# Patient Record
Sex: Male | Born: 1980 | Race: White | Hispanic: No | Marital: Single | Smoking: Current every day smoker
Health system: Southern US, Community
[De-identification: ages and names within clinical notes are randomized; demographics above are authoritative.]

## PROBLEM LIST (undated history)

## (undated) DIAGNOSIS — J189 Pneumonia, unspecified organism: Secondary | ICD-10-CM

## (undated) DIAGNOSIS — F1910 Other psychoactive substance abuse, uncomplicated: Secondary | ICD-10-CM

## (undated) HISTORY — PX: NO PAST SURGERIES: SHX2092

---

## 2005-08-31 ENCOUNTER — Emergency Department: Payer: Self-pay | Admitting: Emergency Medicine

## 2006-06-13 ENCOUNTER — Emergency Department: Payer: Self-pay | Admitting: Unknown Physician Specialty

## 2006-07-30 ENCOUNTER — Emergency Department: Payer: Self-pay | Admitting: Emergency Medicine

## 2007-11-14 ENCOUNTER — Emergency Department: Payer: Self-pay | Admitting: Emergency Medicine

## 2008-03-28 ENCOUNTER — Emergency Department: Payer: Self-pay | Admitting: Emergency Medicine

## 2008-07-02 ENCOUNTER — Emergency Department: Payer: Self-pay | Admitting: Emergency Medicine

## 2008-09-20 ENCOUNTER — Emergency Department: Payer: Self-pay | Admitting: Emergency Medicine

## 2008-09-21 ENCOUNTER — Emergency Department: Payer: Self-pay | Admitting: Unknown Physician Specialty

## 2011-06-13 ENCOUNTER — Emergency Department: Payer: Self-pay | Admitting: Emergency Medicine

## 2013-07-31 ENCOUNTER — Other Ambulatory Visit: Payer: Self-pay

## 2013-07-31 LAB — DRUG SCREEN, URINE
Amphetamines, Ur Screen: NEGATIVE (ref ?–1000)
Barbiturates, Ur Screen: NEGATIVE (ref ?–200)
Benzodiazepine, Ur Scrn: NEGATIVE (ref ?–200)
COCAINE METABOLITE, UR ~~LOC~~: NEGATIVE (ref ?–300)
Cannabinoid 50 Ng, Ur ~~LOC~~: NEGATIVE (ref ?–50)
MDMA (ECSTASY) UR SCREEN: NEGATIVE (ref ?–500)
METHADONE, UR SCREEN: NEGATIVE (ref ?–300)
Opiate, Ur Screen: NEGATIVE (ref ?–300)
PHENCYCLIDINE (PCP) UR S: NEGATIVE (ref ?–25)
Tricyclic, Ur Screen: NEGATIVE (ref ?–1000)

## 2013-08-01 ENCOUNTER — Emergency Department: Payer: Self-pay

## 2013-08-01 LAB — CBC WITH DIFFERENTIAL/PLATELET
BASOS ABS: 0 10*3/uL (ref 0.0–0.1)
Basophil %: 0.4 %
EOS PCT: 0.5 %
Eosinophil #: 0 10*3/uL (ref 0.0–0.7)
HCT: 44.3 % (ref 40.0–52.0)
HGB: 15.2 g/dL (ref 13.0–18.0)
Lymphocyte #: 1.8 10*3/uL (ref 1.0–3.6)
Lymphocyte %: 18.7 %
MCH: 31 pg (ref 26.0–34.0)
MCHC: 34.4 g/dL (ref 32.0–36.0)
MCV: 90 fL (ref 80–100)
MONOS PCT: 7.1 %
Monocyte #: 0.7 x10 3/mm (ref 0.2–1.0)
Neutrophil #: 7.2 10*3/uL — ABNORMAL HIGH (ref 1.4–6.5)
Neutrophil %: 73.3 %
Platelet: 340 10*3/uL (ref 150–440)
RBC: 4.92 10*6/uL (ref 4.40–5.90)
RDW: 13.6 % (ref 11.5–14.5)
WBC: 9.8 10*3/uL (ref 3.8–10.6)

## 2013-08-01 LAB — BASIC METABOLIC PANEL
Anion Gap: 6 — ABNORMAL LOW (ref 7–16)
BUN: 14 mg/dL (ref 7–18)
Calcium, Total: 9.2 mg/dL (ref 8.5–10.1)
Chloride: 107 mmol/L (ref 98–107)
Co2: 27 mmol/L (ref 21–32)
Creatinine: 0.53 mg/dL — ABNORMAL LOW (ref 0.60–1.30)
EGFR (African American): >60
Glucose: 104 mg/dL — ABNORMAL HIGH (ref 65–99)
Osmolality: 280 (ref 275–301)
POTASSIUM: 4 mmol/L (ref 3.5–5.1)
Sodium: 140 mmol/L (ref 136–145)

## 2013-08-01 LAB — TROPONIN I

## 2013-08-01 LAB — URINALYSIS, COMPLETE
BACTERIA: NONE SEEN
BILIRUBIN, UR: NEGATIVE
GLUCOSE, UR: NEGATIVE mg/dL (ref 0–75)
Ketone: NEGATIVE
LEUKOCYTE ESTERASE: NEGATIVE
Nitrite: NEGATIVE
PH: 7 (ref 4.5–8.0)
Protein: NEGATIVE
Specific Gravity: 1.009 (ref 1.003–1.030)
Squamous Epithelial: <1

## 2013-08-01 LAB — TSH: THYROID STIMULATING HORM: 0.741 u[IU]/mL

## 2013-08-02 ENCOUNTER — Emergency Department: Payer: Self-pay | Admitting: Emergency Medicine

## 2013-08-02 LAB — BASIC METABOLIC PANEL
Anion Gap: 4 — ABNORMAL LOW (ref 7–16)
BUN: 20 mg/dL — ABNORMAL HIGH (ref 7–18)
CALCIUM: 9.3 mg/dL (ref 8.5–10.1)
Chloride: 108 mmol/L — ABNORMAL HIGH (ref 98–107)
Co2: 28 mmol/L (ref 21–32)
Creatinine: 0.53 mg/dL — ABNORMAL LOW (ref 0.60–1.30)
EGFR (African American): >60
EGFR (Non-African Amer.): >60
Glucose: 150 mg/dL — ABNORMAL HIGH (ref 65–99)
OSMOLALITY: 285 (ref 275–301)
Potassium: 3.9 mmol/L (ref 3.5–5.1)
Sodium: 140 mmol/L (ref 136–145)

## 2013-08-02 LAB — CBC
HCT: 45.1 % (ref 40.0–52.0)
HGB: 14.9 g/dL (ref 13.0–18.0)
MCH: 29.8 pg (ref 26.0–34.0)
MCHC: 32.9 g/dL (ref 32.0–36.0)
MCV: 90 fL (ref 80–100)
Platelet: 362 10*3/uL (ref 150–440)
RBC: 5 10*6/uL (ref 4.40–5.90)
RDW: 14 % (ref 11.5–14.5)
WBC: 11.8 10*3/uL — AB (ref 3.8–10.6)

## 2013-08-03 LAB — HEPATIC FUNCTION PANEL A (ARMC)
ALBUMIN: 4 g/dL (ref 3.4–5.0)
ALT: 17 U/L (ref 12–78)
AST: 13 U/L — AB (ref 15–37)
Alkaline Phosphatase: 84 U/L
BILIRUBIN TOTAL: 0.2 mg/dL (ref 0.2–1.0)
Bilirubin, Direct: <0.1 mg/dL (ref 0.00–0.20)
Total Protein: 7.6 g/dL (ref 6.4–8.2)

## 2013-08-10 ENCOUNTER — Encounter (HOSPITAL_COMMUNITY): Payer: Self-pay | Admitting: Emergency Medicine

## 2013-08-10 ENCOUNTER — Inpatient Hospital Stay (HOSPITAL_COMMUNITY)
Admission: EM | Admit: 2013-08-10 | Discharge: 2013-08-16 | DRG: 177 | Attending: Internal Medicine | Admitting: Internal Medicine

## 2013-08-10 ENCOUNTER — Emergency Department (HOSPITAL_COMMUNITY)

## 2013-08-10 DIAGNOSIS — E860 Dehydration: Secondary | ICD-10-CM | POA: Diagnosis present

## 2013-08-10 DIAGNOSIS — F17200 Nicotine dependence, unspecified, uncomplicated: Secondary | ICD-10-CM | POA: Diagnosis present

## 2013-08-10 DIAGNOSIS — F1910 Other psychoactive substance abuse, uncomplicated: Secondary | ICD-10-CM | POA: Diagnosis present

## 2013-08-10 DIAGNOSIS — IMO0002 Reserved for concepts with insufficient information to code with codable children: Secondary | ICD-10-CM | POA: Diagnosis not present

## 2013-08-10 DIAGNOSIS — E43 Unspecified severe protein-calorie malnutrition: Secondary | ICD-10-CM

## 2013-08-10 DIAGNOSIS — J690 Pneumonitis due to inhalation of food and vomit: Secondary | ICD-10-CM | POA: Diagnosis present

## 2013-08-10 DIAGNOSIS — Z5987 Material hardship due to limited financial resources, not elsewhere classified: Secondary | ICD-10-CM

## 2013-08-10 DIAGNOSIS — R1310 Dysphagia, unspecified: Secondary | ICD-10-CM | POA: Diagnosis present

## 2013-08-10 DIAGNOSIS — Z79899 Other long term (current) drug therapy: Secondary | ICD-10-CM

## 2013-08-10 DIAGNOSIS — B370 Candidal stomatitis: Secondary | ICD-10-CM | POA: Diagnosis present

## 2013-08-10 DIAGNOSIS — G219 Secondary parkinsonism, unspecified: Secondary | ICD-10-CM | POA: Diagnosis present

## 2013-08-10 DIAGNOSIS — I10 Essential (primary) hypertension: Secondary | ICD-10-CM | POA: Diagnosis present

## 2013-08-10 DIAGNOSIS — G253 Myoclonus: Secondary | ICD-10-CM | POA: Diagnosis present

## 2013-08-10 DIAGNOSIS — J189 Pneumonia, unspecified organism: Secondary | ICD-10-CM

## 2013-08-10 DIAGNOSIS — Z598 Other problems related to housing and economic circumstances: Secondary | ICD-10-CM

## 2013-08-10 DIAGNOSIS — B009 Herpesviral infection, unspecified: Secondary | ICD-10-CM | POA: Diagnosis present

## 2013-08-10 DIAGNOSIS — G20 Parkinson's disease: Secondary | ICD-10-CM | POA: Diagnosis present

## 2013-08-10 DIAGNOSIS — D72829 Elevated white blood cell count, unspecified: Secondary | ICD-10-CM | POA: Diagnosis present

## 2013-08-10 DIAGNOSIS — T434X5A Adverse effect of butyrophenone and thiothixene neuroleptics, initial encounter: Secondary | ICD-10-CM | POA: Diagnosis present

## 2013-08-10 DIAGNOSIS — F1999 Other psychoactive substance use, unspecified with unspecified psychoactive substance-induced disorder: Secondary | ICD-10-CM

## 2013-08-10 DIAGNOSIS — Z23 Encounter for immunization: Secondary | ICD-10-CM

## 2013-08-10 HISTORY — DX: Pneumonia, unspecified organism: J18.9

## 2013-08-10 HISTORY — DX: Other psychoactive substance abuse, uncomplicated: F19.10

## 2013-08-10 LAB — CBC WITH DIFFERENTIAL/PLATELET
Basophils Absolute: 0 10*3/uL (ref 0.0–0.1)
Basophils Relative: 0 % (ref 0–1)
EOS ABS: 0.1 10*3/uL (ref 0.0–0.7)
EOS PCT: 1 % (ref 0–5)
HCT: 43.9 % (ref 39.0–52.0)
HEMOGLOBIN: 15.2 g/dL (ref 13.0–17.0)
LYMPHS ABS: 2 10*3/uL (ref 0.7–4.0)
Lymphocytes Relative: 17 % (ref 12–46)
MCH: 31 pg (ref 26.0–34.0)
MCHC: 34.6 g/dL (ref 30.0–36.0)
MCV: 89.6 fL (ref 78.0–100.0)
MONOS PCT: 6 % (ref 3–12)
Monocytes Absolute: 0.8 10*3/uL (ref 0.1–1.0)
Neutro Abs: 9.2 10*3/uL — ABNORMAL HIGH (ref 1.7–7.7)
Neutrophils Relative %: 76 % (ref 43–77)
PLATELETS: 360 10*3/uL (ref 150–400)
RBC: 4.9 MIL/uL (ref 4.22–5.81)
RDW: 13.5 % (ref 11.5–15.5)
WBC: 12.2 10*3/uL — ABNORMAL HIGH (ref 4.0–10.5)

## 2013-08-10 LAB — COMPREHENSIVE METABOLIC PANEL
ALK PHOS: 78 U/L (ref 39–117)
ALT: 15 U/L (ref 0–53)
AST: 15 U/L (ref 0–37)
Albumin: 4 g/dL (ref 3.5–5.2)
BUN: 16 mg/dL (ref 6–23)
CALCIUM: 9.5 mg/dL (ref 8.4–10.5)
CO2: 29 mEq/L (ref 19–32)
Chloride: 103 mEq/L (ref 96–112)
Creatinine, Ser: 0.63 mg/dL (ref 0.50–1.35)
GFR calc non Af Amer: >90 mL/min (ref >90)
GLUCOSE: 95 mg/dL (ref 70–99)
Potassium: 4 mEq/L (ref 3.7–5.3)
Sodium: 147 mEq/L (ref 137–147)
TOTAL PROTEIN: 7.5 g/dL (ref 6.0–8.3)
Total Bilirubin: 0.7 mg/dL (ref 0.3–1.2)

## 2013-08-10 LAB — I-STAT CHEM 8, ED
BUN: 16 mg/dL (ref 6–23)
CREATININE: 0.7 mg/dL (ref 0.50–1.35)
Calcium, Ion: 1.19 mmol/L (ref 1.12–1.23)
Chloride: 101 mEq/L (ref 96–112)
GLUCOSE: 95 mg/dL (ref 70–99)
HCT: 46 % (ref 39.0–52.0)
HEMOGLOBIN: 15.6 g/dL (ref 13.0–17.0)
Potassium: 3.8 mEq/L (ref 3.7–5.3)
Sodium: 145 mEq/L (ref 137–147)
TCO2: 32 mmol/L (ref 0–100)

## 2013-08-10 LAB — RAPID URINE DRUG SCREEN, HOSP PERFORMED
Amphetamines: NOT DETECTED
BENZODIAZEPINES: NOT DETECTED
Barbiturates: NOT DETECTED
COCAINE: NOT DETECTED
Opiates: NOT DETECTED
Tetrahydrocannabinol: NOT DETECTED

## 2013-08-10 LAB — MRSA PCR SCREENING: MRSA by PCR: NEGATIVE

## 2013-08-10 LAB — LIPASE, BLOOD: Lipase: 12 U/L (ref 11–59)

## 2013-08-10 LAB — I-STAT CG4 LACTIC ACID, ED: Lactic Acid, Venous: 1.48 mmol/L (ref 0.5–2.2)

## 2013-08-10 LAB — ACETAMINOPHEN LEVEL: Acetaminophen (Tylenol), Serum: <15 ug/mL (ref 10–30)

## 2013-08-10 LAB — HIV ANTIBODY (ROUTINE TESTING W REFLEX): HIV: NONREACTIVE

## 2013-08-10 LAB — ETHANOL: Alcohol, Ethyl (B): <11 mg/dL (ref 0–11)

## 2013-08-10 LAB — CK: Total CK: 117 U/L (ref 7–232)

## 2013-08-10 LAB — CBG MONITORING, ED: Glucose-Capillary: 90 mg/dL (ref 70–99)

## 2013-08-10 MED ORDER — DEXTROSE 5 % IV SOLN
500.0000 mg | INTRAVENOUS | Status: DC
  Administered 2013-08-10 – 2013-08-13 (×4): 500 mg via INTRAVENOUS
  Filled 2013-08-10 (×5): qty 500

## 2013-08-10 MED ORDER — CHLORHEXIDINE GLUCONATE 0.12 % MT SOLN
15.0000 mL | Freq: Two times a day (BID) | OROMUCOSAL | Status: DC
  Administered 2013-08-10 – 2013-08-16 (×9): 15 mL via OROMUCOSAL
  Filled 2013-08-10 (×14): qty 15

## 2013-08-10 MED ORDER — SODIUM CHLORIDE 0.9 % IV SOLN
1.5000 g | Freq: Four times a day (QID) | INTRAVENOUS | Status: DC
  Administered 2013-08-10 – 2013-08-14 (×16): 1.5 g via INTRAVENOUS
  Filled 2013-08-10 (×19): qty 1.5

## 2013-08-10 MED ORDER — SODIUM CHLORIDE 0.9 % IV BOLUS (SEPSIS)
1000.0000 mL | Freq: Once | INTRAVENOUS | Status: AC
  Administered 2013-08-10: 1000 mL via INTRAVENOUS

## 2013-08-10 MED ORDER — HALOPERIDOL 2 MG PO TABS
2.0000 mg | ORAL_TABLET | Freq: Three times a day (TID) | ORAL | Status: DC
  Administered 2013-08-10 – 2013-08-11 (×2): 2 mg via ORAL
  Filled 2013-08-10 (×4): qty 1

## 2013-08-10 MED ORDER — MAGIC MOUTHWASH W/LIDOCAINE
10.0000 mL | Freq: Four times a day (QID) | ORAL | Status: DC | PRN
Start: 2013-08-10 — End: 2013-08-11
  Administered 2013-08-10: 10 mL via ORAL
  Filled 2013-08-10: qty 10

## 2013-08-10 MED ORDER — OLANZAPINE 10 MG PO TABS
10.0000 mg | ORAL_TABLET | Freq: Every day | ORAL | Status: DC
  Administered 2013-08-10 – 2013-08-14 (×5): 10 mg via ORAL
  Filled 2013-08-10 (×6): qty 1

## 2013-08-10 MED ORDER — WHITE PETROLATUM GEL
Status: DC | PRN
  Administered 2013-08-10: 0.2 via TOPICAL
  Filled 2013-08-10: qty 5
  Filled 2013-08-10: qty 28.35

## 2013-08-10 MED ORDER — PNEUMOCOCCAL VAC POLYVALENT 25 MCG/0.5ML IJ INJ
0.5000 mL | INJECTION | INTRAMUSCULAR | Status: AC
  Administered 2013-08-11: 0.5 mL via INTRAMUSCULAR
  Filled 2013-08-10: qty 0.5

## 2013-08-10 MED ORDER — SODIUM CHLORIDE 0.9 % IV SOLN
INTRAVENOUS | Status: AC
  Administered 2013-08-10: 1000 mL via INTRAVENOUS

## 2013-08-10 MED ORDER — HEPARIN SODIUM (PORCINE) 5000 UNIT/ML IJ SOLN
5000.0000 [IU] | Freq: Three times a day (TID) | INTRAMUSCULAR | Status: DC
  Administered 2013-08-11 – 2013-08-16 (×16): 5000 [IU] via SUBCUTANEOUS
  Filled 2013-08-10 (×20): qty 1

## 2013-08-10 MED ORDER — BIOTENE DRY MOUTH MT LIQD: 15.0000 mL | Freq: Two times a day (BID) | OROMUCOSAL | Status: DC

## 2013-08-10 MED ORDER — NYSTATIN 100000 UNIT/ML MT SUSP
5.0000 mL | Freq: Four times a day (QID) | OROMUCOSAL | Status: DC
  Administered 2013-08-10 – 2013-08-16 (×21): 500000 [IU] via ORAL
  Filled 2013-08-10 (×25): qty 5

## 2013-08-10 MED ORDER — LORAZEPAM 1 MG PO TABS
2.0000 mg | ORAL_TABLET | Freq: Three times a day (TID) | ORAL | Status: DC
  Administered 2013-08-10 – 2013-08-11 (×2): 2 mg via ORAL
  Filled 2013-08-10 (×2): qty 2

## 2013-08-10 MED ORDER — INFLUENZA VAC SPLIT QUAD 0.5 ML IM SUSP
0.5000 mL | INTRAMUSCULAR | Status: AC
  Administered 2013-08-11: 0.5 mL via INTRAMUSCULAR
  Filled 2013-08-10: qty 0.5

## 2013-08-10 MED ORDER — BENZTROPINE MESYLATE 1 MG PO TABS
1.0000 mg | ORAL_TABLET | Freq: Three times a day (TID) | ORAL | Status: DC
  Administered 2013-08-10 – 2013-08-11 (×2): 1 mg via ORAL
  Filled 2013-08-10 (×4): qty 1

## 2013-08-10 NOTE — ED Notes (Signed)
Pt unable to drink PO fluids.  Pt drooling upon trying.  Still reporting weakness.

## 2013-08-10 NOTE — Progress Notes (Signed)
Patient didn't voide since he is being in ED. At bladder scan was found 527cc urine and I notified K. Schorr, NP at 19:30 o'clock. We are waiting for new order.

## 2013-08-10 NOTE — ED Notes (Signed)
Pt back from x-ray.

## 2013-08-10 NOTE — ED Notes (Signed)
Spoke with April-- charge nurse at jail -- [phone]---hx -- [facility]-- was stumbling, falling down, hitting head-- withdrawing from opiates and suboxone. Placed on Jail withdrawal policy receiving Ativan, meclizine, and was started on Haldol, cogentin due to admitting auditory and visual hallucinations.  Was [facility]- from 2/4--2/18-- did not receive any meds while there. [facility], pt was drooling, speech difficult to understand, was sent to medical unit and received IV fluids, IV ativan. Has not improved-- sent here.

## 2013-08-10 NOTE — ED Notes (Signed)
Admitting at bedside 

## 2013-08-10 NOTE — ED Notes (Signed)
Report called to Angela, RN.

## 2013-08-10 NOTE — ED Notes (Signed)
TO ED via sheriff's deputy [facility]-- with c/o weakness- drooling, unable to control secretions. Pt extremely emaciated, drooling, able to talk, but speech difficult to understand. Tongue dry coated with brownish dried mucus. Alert/ Oriented x 3-- deputy states that [facility], was [facility], and returned last week in this condition. [facility] since last Monday receiving IV fluids.

## 2013-08-10 NOTE — Progress Notes (Signed)
Returned call from Merdis DelayK. Schorr, NP and she gave an order for in/out cath.

## 2013-08-10 NOTE — ED Notes (Signed)
Oral hygiene done--

## 2013-08-10 NOTE — H&P (Signed)
[facility] History and Physical  Chase Bush WGN:[id number] DOB: 30-Sep-1980 DOA: 08/10/2013  Referring physician: Dr. Rubin Payor PCP: No PCP Per Patient   Chief Complaint: not able to swallow  HPI: Chase Bush is a 33 y.o. male  With past medical history of psychiatric disorders also been so withdrawals, who was sent from [facility] as he was spitting up his med as he couldn't swallow. He's also been complaining of drooling, considerable speech difficulties , tremor in his upper extremities and lacks the ability of performing ADLs. The patient relates some cough with drinking fluids and relates that he cannot swallow.  In the ED: Vitals were within normal limits, basic metabolic panel was done that showed her creatinine at baseline a CBC was done that shows a white count of 12.2 chest x-ray was done that shows a right lower lobe infiltrate we're consulted for further evaluation.   Review of Systems:  Constitutional:  No weight loss, night sweats, Fevers, chills, fatigue.  HEENT:  Drooling, mouth pain Cardio-vascular:  No chest pain, Orthopnea, PND, swelling in lower extremities, anasarca, dizziness, palpitations  GI:  No heartburn, indigestion, abdominal pain, nausea, vomiting, diarrhea, change in bowel habits, loss of appetite  Resp:  No shortness of breath with exertion or at rest. .No wheezing.No chest wall deformity  Skin:  no rash or lesions.  GU:  no dysuria, change in color of urine, no urgency or frequency. No flank pain.  Musculoskeletal:  No joint pain or swelling. No decreased range of motion. No back pain.  Psych:  No change in mood or affect. No depression or anxiety. No memory loss.   Past Medical History  Diagnosis Date  . Substance abuse     was using suboxone until Jan 3rd. also hx of benzo abuse.    No past surgical history on file. Social History:  reports that he has quit smoking. He does not have any smokeless tobacco  history on file. He reports that he uses illicit drugs. He reports that he does not drink alcohol.  No Known Allergies  History reviewed. No pertinent family history.   Prior to Admission medications   Medication Sig Start Date End Date Taking? Authorizing Provider  benztropine (COGENTIN) 1 MG tablet Take 1 mg by mouth 3 (three) times daily.   Yes Historical Provider, MD  haloperidol (HALDOL) 10 MG tablet Take 10 mg by mouth 3 (three) times daily.   Yes Historical Provider, MD  LORazepam (ATIVAN) 2 MG tablet Take 2 mg by mouth 3 (three) times daily.   Yes Historical Provider, MD  OLANZapine (ZYPREXA) 10 MG tablet Take 10 mg by mouth at bedtime.   Yes Historical Provider, MD   Physical Exam: Filed Vitals:   08/10/13 1630  BP: 136/80  Pulse: 55  Temp:   Resp: 9    BP 136/80  Pulse 55  Temp(Src) 98 F (36.7 C) (Oral)  Resp 9  SpO2 100%  General:  Appears calm and comfortable, cachectic appearing Eyes: PERRL, normal lids, irises & conjunctiva ENT: Dry mucous membranes Neck: no LAD, masses or thyromegaly Cardiovascular: RRR, no m/r/g. No LE edema. Telemetry: SR, no arrhythmias  Respiratory: Good air movement with crackles on the right no wheezing. Abdomen: soft, ntnd Skin: no rash or induration seen on limited exam. Musculoskeletal: grossly normal tone BUE/BLE Psychiatric: Decrease mood and affect Neurologic: Mild clonus in his upper extremities, with stiff neck and stiff movements passive and active. 3-12 are grossly intact, sensation is intact.  Labs on Admission:  Basic Metabolic Panel:  Recent Labs Lab 08/10/13 1420 08/10/13 1501  NA 147 145  K 4.0 3.8  CL 103 101  CO2 29  --   GLUCOSE 95 95  BUN 16 16  CREATININE 0.63 0.70  CALCIUM 9.5  --    Liver Function Tests:  Recent Labs Lab 08/10/13 1420  AST 15  ALT 15  ALKPHOS 78  BILITOT 0.7  PROT 7.5  ALBUMIN 4.0    Recent Labs Lab 08/10/13 1420  LIPASE 12   No results found for this  basename: AMMONIA,  in the last 168 hours CBC:  Recent Labs Lab 08/10/13 1420 08/10/13 1501  WBC 12.2*  --   NEUTROABS 9.2*  --   HGB 15.2 15.6  HCT 43.9 46.0  MCV 89.6  --   PLT 360  --    Cardiac Enzymes:  Recent Labs Lab 08/10/13 1420  CKTOTAL 117    BNP (last 3 results) No results found for this basename: PROBNP,  in the last 8760 hours CBG:  Recent Labs Lab 08/10/13 1359  GLUCAP 90    Radiological Exams on Admission: Dg Chest 2 View  08/10/2013   CLINICAL DATA:  Clinical dehydration  EXAM: CHEST  2 VIEW  COMPARISON:  DG CHEST 2V dated 08/01/2013  FINDINGS: The lungs are well-expanded. There is new mildly increased density adjacent to the cardiac apex on the frontal film which projects in the retrocardiac region on the lateral film. There is no pleural effusion or pneumothorax. The cardiac silhouette is normal in size. The pulmonary vascularity is not engorged. The mediastinum is normal in width. The observed portions of the bony thorax appear normal.  IMPRESSION: There is minimal subsegmental atelectasis or early pneumonia in the anterior aspect of the left lower lobe. Elsewhere the lung parenchyma appears clear. There is no evidence of CHF.   Electronically Signed   By: David  [place]az[place]   On: 08/10/2013 16:29    EKG: Independently reviewed. SR  Assessment/Plan Aspiration pneumonia: - I will go ahead and start him on Unasyn and azithromycin to cover for community associated pneumonia the most likely cause of his infiltrate on chest x-ray and leukocytosis this aspiration pneumonia. - Get sputum cultures. Place him n.p.o. place an NG tube and start Jevity. - Urine Legionella, will consult dietitian to strut Jevity. - Tylenol for fever.  Dehydration: - Continue IV fluids monitor strict I's and O's.  Myoclonus/ parkinsonian syndrome: - This most likely a side effect of one of his medications I am guessing Haldol. We'll go ahead and decrease the dose of Haldol continue  the Cogentin we'll consult psychiatry.  Severe protein caloric malnutrition: - Unclear etiology we'll consult dietitian for tube feeding. Question is this due to side effects of this medication.  Code Status: full Family Communication: none Disposition Plan: inpatient  Time spent: 85 minutes  Marinda ElkFELIZ ORTIZ, ABRAHAM [facility] Pager [phone]

## 2013-08-10 NOTE — ED Provider Notes (Signed)
CSN: [id number]     Arrival date & time 08/10/13  1345 History   First MD Initiated Contact with Patient 08/10/13 1351     No chief complaint on file.  HPI Comments: 33 yo M hx of unknown psychiatric disorder presents from jail with CC dehydration.  Pt has been in jail system since January 3rd.  At that time pt was being treated for opiate, and benzo withdrawal (hx of suboxone, xanax abuse).  At that time he had symptoms of falling, hitting head, drooling, auditory and visual hallucination.  He was started on Haldol, Ativan, Zyprexa, and Cogentin at that time.  Pt left [place] on 2/4, to [place] from 2/4-2/18 and was off of all medications at that time.  He returned 1 week ago.  It was noted at that time of return that pt was "withdrawing".  He was restarted on IV Ativan, IVF, and then transitioned to Haldol 10 mg PO TID, Ativan 2 mg PO TID, Zyprexa 10 mg PO QHS, and Cogentin 1 mg PO TID.  Pt states he has been unable to eat in the last three days, and feels very fatigued.  He states his mouth hurts, and it very dry, and that he feels unable to swallow.  He denies fever, chills, CP, cough, dyspnea, abdominal pain, nausea, vomiting, diarrhea, constipation, rash, myalgias, or any other symptoms.    The history is provided by the patient and the EMS personnel. No language interpreter was used.    No past medical history on file. No past surgical history on file. No family history on file. History  Substance Use Topics  . Smoking status: Not on file  . Smokeless tobacco: Not on file  . Alcohol Use: Not on file    Review of Systems  Constitutional: Positive for fatigue. Negative for fever and chills.  HENT: Positive for mouth sores and trouble swallowing. Negative for congestion, rhinorrhea, sinus pressure, sore throat and voice change.   Respiratory: Negative for cough and shortness of breath.   Cardiovascular: Negative for chest pain.  Gastrointestinal: Negative for nausea,  vomiting, abdominal pain and constipation.  Genitourinary: Negative for dysuria, hematuria and difficulty urinating.  Musculoskeletal: Negative for myalgias.  Skin: Negative for rash.  Neurological: Negative for dizziness, weakness, light-headedness, numbness and headaches.  Hematological: Negative for adenopathy. Does not bruise/bleed easily.  Psychiatric/Behavioral: Negative for suicidal ideas, hallucinations and self-injury.  All other systems reviewed and are negative.      Allergies  Review of patient's allergies indicates not on file.  Home Medications  No current outpatient prescriptions on file. There were no vitals taken for this visit. Physical Exam  Nursing note and vitals reviewed. Constitutional: He is oriented to person, place, and time.  Cachexia, generally ill appearing.  HENT:  Head: Normocephalic.  Right Ear: External ear normal.  Left Ear: External ear normal.  Dry oral mucosa.  Oral candidiasis with mucosal breakdown.  Poor dentition.    Eyes: Conjunctivae and EOM are normal. Pupils are equal, round, and reactive to light.  Neck: Normal range of motion. Neck supple.  Cardiovascular: Normal rate, regular rhythm, normal heart sounds and intact distal pulses.   Pulmonary/Chest: Effort normal and breath sounds normal. No respiratory distress. He has no wheezes. He has no rales. He exhibits no tenderness.  Abdominal: Soft. Bowel sounds are normal. He exhibits no distension and no mass. There is no tenderness. There is no rebound and no guarding.  Musculoskeletal: Normal range of motion.  Neurological: He  is alert and oriented to person, place, and time.  CN II-XII grossly intact.  4/5 strength in bilateral hands.  5/5 strength BLE.  No clonus.  No sensory deficit.    Skin: Skin is warm and dry.    ED Course  Procedures (including critical care time) Labs Review Labs Reviewed  CBC WITH DIFFERENTIAL - Abnormal; Notable for the following:    WBC 12.2 (*)     Neutro Abs 9.2 (*)    All other components within normal limits  COMPREHENSIVE METABOLIC PANEL  LIPASE, BLOOD  ACETAMINOPHEN LEVEL  ETHANOL  CK  URINALYSIS, ROUTINE W REFLEX MICROSCOPIC  URINE RAPID DRUG SCREEN (HOSP PERFORMED)  HIV ANTIBODY (ROUTINE TESTING)  I-STAT CG4 LACTIC ACID, ED  I-STAT CHEM 8, ED  CBG MONITORING, ED   Imaging Review Dg Chest 2 View  08/10/2013   CLINICAL DATA:  Clinical dehydration  EXAM: CHEST  2 VIEW  COMPARISON:  DG CHEST 2V dated 08/01/2013  FINDINGS: The lungs are well-expanded. There is new mildly increased density adjacent to the cardiac apex on the frontal film which projects in the retrocardiac region on the lateral film. There is no pleural effusion or pneumothorax. The cardiac silhouette is normal in size. The pulmonary vascularity is not engorged. The mediastinum is normal in width. The observed portions of the bony thorax appear normal.  IMPRESSION: There is minimal subsegmental atelectasis or early pneumonia in the anterior aspect of the left lower lobe. Elsewhere the lung parenchyma appears clear. There is no evidence of CHF.   Electronically Signed   By: David  [place]   On: 08/10/2013 16:29    EKG Interpretation   None       MDM   Final diagnoses:  None   33 yo M hx of unknown psychiatric disorder presents from jail with CC dehydration.  Filed Vitals:   08/10/13 1406  BP: 149/92  Temp: 98 F (36.7 C)  Resp: 16   Physical exam as above.  Pt afebrile.  Pt appears ill, dehydrated, oral candidiasis with some mucosal breakdown.  Pt states feels very fatigued.   CBC with mild leukocytosis w/ left shift, CMP, Lipase, Tylenol, LA, Ethanol, CK, all WNL.  HIV antibody sent.  Pt hydrated w/ 2 L NS.  Mouth care performed by nursing.  CXR performed which demonstrates possible LLL early PNA vs atelectasis.  Pt unable to swallow water, and unable to stand without much assistance per nursing.   Given concern for possible PNA, inability to tolerate PO,  inability to ambulate, dehydration, pt to be admitted to medicine service for further management.  Pt and officer understand and agree with plan.  Pt's care plan discussed with Dr. Rubin PayorPickering.    Jon GillsWebb, Emiliana Blaize, MD       Jon GillsZach Soni Kegel, MD 08/10/13 [phone]

## 2013-08-10 NOTE — Progress Notes (Signed)
Patient trasfered from ED to 5W10  via stretcher accompanied by police officer; alert and oriented x 4; no complaints of pain;  fluids running in .left  forearm; bruises on left leg and left hip; skin intact. Orient patient to room and unit; watch safety video; gave patient care guide; instructed how to use the call bell and  fall risk precautions. Will continue to monitor the patient.

## 2013-08-10 NOTE — Progress Notes (Signed)
In and Out Cath performed with 800cc of dark yellow urine noted on return. Pt tolerated procedure well.    Derrisha Foos RN

## 2013-08-11 ENCOUNTER — Inpatient Hospital Stay (HOSPITAL_COMMUNITY)

## 2013-08-11 LAB — LEGIONELLA ANTIGEN, URINE: Legionella Antigen, Urine: NEGATIVE

## 2013-08-11 MED ORDER — LORAZEPAM 1 MG PO TABS
1.0000 mg | ORAL_TABLET | Freq: Three times a day (TID) | ORAL | Status: DC
  Administered 2013-08-11 – 2013-08-13 (×7): 1 mg via ORAL
  Filled 2013-08-11 (×7): qty 1

## 2013-08-11 MED ORDER — VITAMIN B-1 100 MG PO TABS
100.0000 mg | ORAL_TABLET | Freq: Every day | ORAL | Status: DC
  Administered 2013-08-11 – 2013-08-16 (×5): 100 mg via ORAL
  Filled 2013-08-11 (×6): qty 1

## 2013-08-11 MED ORDER — FOLIC ACID 1 MG PO TABS
1.0000 mg | ORAL_TABLET | Freq: Every day | ORAL | Status: DC
  Administered 2013-08-11 – 2013-08-16 (×5): 1 mg via ORAL
  Filled 2013-08-11 (×6): qty 1

## 2013-08-11 MED ORDER — MAGIC MOUTHWASH W/LIDOCAINE
5.0000 mL | ORAL | Status: DC
  Administered 2013-08-11 – 2013-08-16 (×25): 5 mL via ORAL
  Filled 2013-08-11 (×38): qty 5

## 2013-08-11 MED ORDER — RESOURCE THICKENUP CLEAR PO POWD
ORAL | Status: DC | PRN
  Filled 2013-08-11: qty 125

## 2013-08-11 MED ORDER — HALOPERIDOL 1 MG PO TABS
1.0000 mg | ORAL_TABLET | Freq: Three times a day (TID) | ORAL | Status: DC
  Filled 2013-08-11 (×2): qty 1

## 2013-08-11 MED ORDER — IOHEXOL 300 MG/ML  SOLN
80.0000 mL | Freq: Once | INTRAMUSCULAR | Status: AC | PRN
  Administered 2013-08-11: 75 mL via INTRAVENOUS

## 2013-08-11 MED ORDER — ENSURE PUDDING PO PUDG
1.0000 | Freq: Two times a day (BID) | ORAL | Status: DC
  Administered 2013-08-12 – 2013-08-14 (×5): 1 via ORAL

## 2013-08-11 NOTE — Progress Notes (Signed)
INITIAL NUTRITION ASSESSMENT  DOCUMENTATION CODES Per approved criteria  -Severe malnutrition in the context of chronic illness   INTERVENTION:  Ensure Pudding BID between meals, each supplement provides 170 kcal and 4 grams protein  Magic Cup with meals, each supplement provides 290 kcal and 9 grams protein  If TF initiated recommend starting Jevity 1.2 via NGT at 20 ml/hr and increasing by 10 ml/hr every 8 hours to reach goal rate of 65 ml/hr to provide 1872 kcal, 87 grams protein, and 1259 ml free water.  Add Prostat BID to provide an additional 200 kcal and 30 grams protein  Above TF regimen plus Prostat provides 2072 kcal (100% of estimated needs), 117 grams protein (100% of estimated needs), and 1259 ml free water  Patient is at risk for refeeding syndrome as he is severely malnourished. Recommend monitoring potassium, magnesium, and phosphorous for 3 days.  NUTRITION DIAGNOSIS: Inadequate oral intake related to oral discomfort and swallowing difficulty as evidenced by reported PO intake and weight loss.   Goal: Patient to meet >/= 90% of estimated nutrition needs  Monitor:  TF tolerance/adequacy, PO diet advancement, I/Os, weight trends, labs  Reason for Assessment: MD Consult for TF Initiation/Management and Malnutrition Screening Tool Risk  33 y.o. male  Admitting Dx: Aspiration pneumonia  ASSESSMENT: Patient with past medical history of psychiatric disorders and withdrawals, [facility] as he was spitting up his med as he couldn't swallow. He's also been complaining of drooling, considerable speech difficulties , tremor in his upper extremities and lacks the ability of performing ADLs. The patient relates some cough with drinking fluids and relates that he cannot swallow.  Per conversation with nurse, patient has severe thrush in tongue that makes it difficult to understand and communicate with the patient. Upon dietetic intern visit, SLP  was at bedside. Per SLP, patient is able to swallow some textures with difficulty. SLP recommends dysphagia 2 diet with nectar thick liquids. RD team received MD consult for TF initiation and management, however, per conversation with PA, team will hold placing NGT for TF and SLP will trial PO diet.   Patient reported that his usual weight is around 160-170 lbs and he started losing weight around 1 month ago. The patient stated that he has an appetite, but is unable to swallow food. Patient has had a 5-11% weight loss in one month.   Nutrition Focused Physical Exam:  Subcutaneous Fat:  Orbital Region: mild depletion Upper Arm Region: moderate depletion Thoracic and Lumbar Region: N/A  Muscle:  Temple Region: moderate depletion Clavicle Bone Region: severe depletion Clavicle and Acromion Bone Region: severe depletion Scapular Bone Region: N/A Dorsal Hand: mild depletion Patellar Region: moderate depletion Anterior Thigh Region: mild depletion Posterior Calf Region: mild depletion  Edema: none noted  Patient meets criteria for severe malnutrition in the context of chronic illness as evidenced by >5% weight loss in 1 month and severe muscle mass depletion.   Patient is at risk for refeeding syndrome. Recommend monitoring potassium, magnesium, and phosphorous for 3 days.   Height: Ht Readings from Last 1 Encounters:  08/11/13 6\' 2"  (1.88 m)    Weight: Wt Readings from Last 1 Encounters:  08/11/13 152 lb 14.4 oz (69.355 kg)    Ideal Body Weight: 190 lb (86.4 kg)  % Ideal Body Weight: 80%  Wt Readings from Last 10 Encounters:  08/11/13 152 lb 14.4 oz (69.355 kg)    Usual Body Weight: 160-170 lb  % Usual Body Weight:  89-95%  BMI:  Body mass index is 19.62 kg/(m^2).  Estimated Nutritional Needs: Kcal: 1900-2100 Protein: 105-115 grams Fluid: 1.9-2.1 L  Skin: no wounds  Diet Order: NPO  EDUCATION NEEDS: -No education needs identified at this time   Intake/Output  Summary (Last 24 hours) at 08/11/13 1006 Last data filed at 08/11/13 0140  Gross per 24 hour  Intake    110 ml  Output   1000 ml  Net   -890 ml    Last BM: 2/23  Labs:   Recent Labs Lab 08/10/13 1420 08/10/13 1501  NA 147 145  K 4.0 3.8  CL 103 101  CO2 29  --   BUN 16 16  CREATININE 0.63 0.70  CALCIUM 9.5  --   GLUCOSE 95 95    CBG (last 3)   Recent Labs  08/10/13 1359  GLUCAP 90    Scheduled Meds: . ampicillin-sulbactam (UNASYN) IV  1.5 g Intravenous Q6H  . azithromycin  500 mg Intravenous Q24H  . benztropine  1 mg Oral TID  . chlorhexidine  15 mL Mouth Rinse BID  . haloperidol  2 mg Oral TID  . heparin  5,000 Units Subcutaneous 3 times per day  . influenza vac split quadrivalent PF  0.5 mL Intramuscular Tomorrow-1000  . LORazepam  2 mg Oral TID  . magic mouthwash w/lidocaine  5 mL Oral Q4H  . nystatin  5 mL Oral QID  . OLANZapine  10 mg Oral QHS  . pneumococcal 23 valent vaccine  0.5 mL Intramuscular Tomorrow-1000    Continuous Infusions: . sodium chloride 1,000 mL (08/10/13 1900)    Past Medical History  Diagnosis Date  . Substance abuse     was using suboxone until Jan 3rd. also hx of benzo abuse.   . Pneumonia     "when I was little; got it again now" (08/10/2013)    Past Surgical History  Procedure Laterality Date  . No past surgeries      Marlane Mingle, Dietetic Intern Pager: [phone]  I agree with the above information and made appropriate revisions. Jarold Motto MS, RD, LDN Inpatient Registered Dietitian Pager: [phone] After-hours pager: [phone]

## 2013-08-11 NOTE — Evaluation (Signed)
Clinical/Bedside Swallow Evaluation Patient Details  Name: Samantha CrimesMarshall Tumblin MRN: [id number] Date of Birth: 07/04/1980  Today's Date: 08/11/2013 Time: 0939-1000 SLP Time Calculation (min): 21 min  Past Medical History:  Past Medical History  Diagnosis Date  . Substance abuse     was using suboxone until Jan 3rd. also hx of benzo abuse.   . Pneumonia     "when I was little; got it again now" (08/10/2013)   Past Surgical History:  Past Surgical History  Procedure Laterality Date  . No past surgeries     HPI:  Samantha CrimesMarshall Cottone is a 33 y.o. male with past medical history of psychiatric disorders also benzo withdrawals, [facility] as he was spitting up his meds as he couldn't swallow. He's also been complaining of drooling, considerable speech difficulties , tremor in his upper extremities and lacks the ability of performing ADLs. The patient relates some cough with drinking fluids and relates that he cannot swallow. Pt is supected to have aspiration pna.    Assessment / Plan / Recommendation Clinical Impression  Pt demosntrates a combination of generalized weakness and poor effort concerning for psychogenic etiology of dysphagia.   Pt is not managing secretions at baseline; gums are bleeding and oral health is poor. SLP cleaned pts mouth with some improvement and pt was noted to have gag response. Oral motor assessment revealed decreased elevation of right lips with smile, but question if this was lack of effot on pts part. Speech is dysarthric with limited movment of tongue, though at sound level pt demonstrates ability to make adequate articulatory contacts.   With initial ice chip pt immediately transited and swallowed without difficulty. With liquids however pt made no effort to utilize tongue to tranist bolus, saying "i cant!" He used inspiratory suction to transit bolus to pharynx and successfully swallowed without evidence of aspiration.   In  summary, pt appears to have an atypical dysphagia with generalized weakness but also poor effort with probable behavioral component to dysphagia. Recommend initaiting a dys 2 diet with nectar thick liquids to make food still appear palatable but also encourage safety and monitor pts intake over a day prior to initiating alternate nutrition if needed. Pt will need assist for feeding.     Aspiration Risk  Mild    Diet Recommendation Dysphagia 2 (Fine chop);Nectar-thick liquid   Liquid Administration via: Cup;Straw Medication Administration: Crushed with puree Supervision: Staff to assist with self feeding Compensations: Slow rate;Small sips/bites Postural Changes and/or Swallow Maneuvers: Seated upright 90 degrees;Upright 30-60 min after meal;Out of bed for meals    Other  Recommendations Oral Care Recommendations: Oral care Q4 per protocol Other Recommendations: Order thickener from pharmacy   Follow Up Recommendations  Home health SLP    Frequency and Duration min 2x/week  2 weeks   Pertinent Vitals/Pain NA    SLP Swallow Goals     Swallow Study Prior Functional Status       General HPI: Samantha CrimesMarshall Sterba is a 33 y.o. male with past medical history of psychiatric disorders also benzo withdrawals, who was [facility] as he was spitting up his meds as he couldn't swallow. He's also been complaining of drooling, considerable speech difficulties , tremor in his upper extremities and lacks the ability of performing ADLs. The patient relates some cough with drinking fluids and relates that he cannot swallow. Pt is supected to have aspiration pna.  Type of Study: Bedside swallow evaluation Diet Prior to  this Study: NPO Temperature Spikes Noted: No Respiratory Status: Room air History of Recent Intubation: No Behavior/Cognition: Alert;Confused;Requires cueing Oral Cavity - Dentition: Adequate natural dentition Self-Feeding Abilities: Total assist;Needs  assist Patient Positioning: Upright in bed Baseline Vocal Quality: Low vocal intensity Volitional Cough: Weak Volitional Swallow: Unable to elicit    Oral/Motor/Sensory Function Overall Oral Motor/Sensory Function: Impaired Labial ROM: Reduced right Labial Symmetry: Abnormal symmetry right Labial Strength: Reduced Labial Sensation:  (UTA) Lingual ROM: Within Functional Limits Lingual Symmetry: Within Functional Limits Lingual Strength: Reduced Facial ROM: Reduced right Facial Symmetry: Right droop Facial Strength: Reduced Velum: Within Functional Limits Mandible: Within Functional Limits   Ice Chips Ice chips: Within functional limits   Thin Liquid Thin Liquid: Impaired Presentation: Cup;Straw Oral Phase Impairments: Reduced labial seal;Reduced lingual movement/coordination;Impaired anterior to posterior transit Oral Phase Functional Implications: Right anterior spillage;Right lateral sulci pocketing;Prolonged oral transit;Oral holding Pharyngeal  Phase Impairments: Decreased hyoid-laryngeal movement;Suspected delayed Swallow    Nectar Thick Nectar Thick Liquid: Not tested   Honey Thick Honey Thick Liquid: Not tested   Puree Puree: Impaired Presentation: Spoon Oral Phase Impairments: Reduced labial seal;Reduced lingual movement/coordination;Impaired anterior to posterior transit Oral Phase Functional Implications: Oral holding;Prolonged oral transit Pharyngeal Phase Impairments: Suspected delayed Swallow   Solid   GO    Solid: Not tested      Harlon Ditty, MA CCC-SLP [phone]  Musette Kisamore, Riley Nearing 08/11/2013,10:35 AM

## 2013-08-11 NOTE — Progress Notes (Signed)
Pt with some urinary retention. Foley ordered and inserted per K. Schorr's orders. 10 cc inserted in balloon and 200 cc of urine returned

## 2013-08-11 NOTE — Progress Notes (Signed)
PT with bladder scan showed >400 cc. Paged K. Schorr. Pt to get foley placed

## 2013-08-11 NOTE — Consult Note (Signed)
[facility] Reason for consult: Odynophagia Referring Physician: [facility]  Chase Bush is an 33 y.o. male.  HPI: he has been incarcerated at the [facility] and got to the point where he was unable to swallow his medications or food due to severe pain. He was having difficulty speaking and was drooling. He has a past history of psychiatric disorders. He was noted in the ER on a mission to have severe oral ulcerations. The patient reports to me that he has been incarcerated for 45 days and up until 10 days ago was able to eat and drink without any problems. He's never had EGD denies any history of chronic heartburn. He does have a history of substance abuse.  Past Medical History  Diagnosis Date  . Substance abuse     was using suboxone until Jan 3rd. also hx of benzo abuse.   . Pneumonia     "when I was little; got it again now" (08/10/2013)    Past Surgical History  Procedure Laterality Date  . No past surgeries      History reviewed. No pertinent family history.  Social History:  reports that he has been smoking Cigarettes.  He has a 21 pack-year smoking history. He has never used smokeless tobacco. He reports that he uses illicit drugs. He reports that he does not drink alcohol.  Allergies: No Known Allergies  Medications; Prior to Admission medications   Medication Sig Start Date End Date Taking? Authorizing Provider  benztropine (COGENTIN) 1 MG tablet Take 1 mg by mouth 3 (three) times daily.   Yes Historical Provider, MD  haloperidol (HALDOL) 10 MG tablet Take 10 mg by mouth 3 (three) times daily.   Yes Historical Provider, MD  LORazepam (ATIVAN) 2 MG tablet Take 2 mg by mouth 3 (three) times daily.   Yes Historical Provider, MD  OLANZapine (ZYPREXA) 10 MG tablet Take 10 mg by mouth at bedtime.   Yes Historical Provider, MD   . ampicillin-sulbactam (UNASYN) IV  1.5 g Intravenous Q6H  . azithromycin  500 mg Intravenous Q24H  .  chlorhexidine  15 mL Mouth Rinse BID  . feeding supplement (ENSURE)  1 Container Oral BID BM  . folic acid  1 mg Oral Daily  . heparin  5,000 Units Subcutaneous 3 times per day  . LORazepam  1 mg Oral TID  . magic mouthwash w/lidocaine  5 mL Oral Q4H  . nystatin  5 mL Oral QID  . OLANZapine  10 mg Oral QHS  . thiamine  100 mg Oral Daily   PRN Meds RESOURCE THICKENUP CLEAR, white petrolatum Results for orders placed during the hospital encounter of 08/10/13 (from the past 48 hour(s))  CBG MONITORING, ED     Status: None   Collection Time    08/10/13  1:59 PM      Result Value Ref Range   Glucose-Capillary 90  70 - 99 mg/dL  CBC WITH DIFFERENTIAL     Status: Abnormal   Collection Time    08/10/13  2:20 PM      Result Value Ref Range   WBC 12.2 (*) 4.0 - 10.5 K/uL   RBC 4.90  4.22 - 5.81 MIL/uL   Hemoglobin 15.2  13.0 - 17.0 g/dL   HCT 43.9  39.0 - 52.0 %   MCV 89.6  78.0 - 100.0 fL   MCH 31.0  26.0 - 34.0 pg   MCHC 34.6  30.0 - 36.0 g/dL   RDW 13.5  11.5 - 15.5 %   Platelets 360  150 - 400 K/uL   Neutrophils Relative % 76  43 - 77 %   Neutro Abs 9.2 (*) 1.7 - 7.7 K/uL   Lymphocytes Relative 17  12 - 46 %   Lymphs Abs 2.0  0.7 - 4.0 K/uL   Monocytes Relative 6  3 - 12 %   Monocytes Absolute 0.8  0.1 - 1.0 K/uL   Eosinophils Relative 1  0 - 5 %   Eosinophils Absolute 0.1  0.0 - 0.7 K/uL   Basophils Relative 0  0 - 1 %   Basophils Absolute 0.0  0.0 - 0.1 K/uL  COMPREHENSIVE METABOLIC PANEL     Status: None   Collection Time    08/10/13  2:20 PM      Result Value Ref Range   Sodium 147  137 - 147 mEq/L   Potassium 4.0  3.7 - 5.3 mEq/L   Chloride 103  96 - 112 mEq/L   CO2 29  19 - 32 mEq/L   Glucose, Bld 95  70 - 99 mg/dL   BUN 16  6 - 23 mg/dL   Creatinine, Ser 0.63  0.50 - 1.35 mg/dL   Calcium 9.5  8.4 - 10.5 mg/dL   Total Protein 7.5  6.0 - 8.3 g/dL   Albumin 4.0  3.5 - 5.2 g/dL   AST 15  0 - 37 U/L   ALT 15  0 - 53 U/L   Alkaline Phosphatase 78  39 - 117 U/L    Total Bilirubin 0.7  0.3 - 1.2 mg/dL   GFR calc non Af Amer >90  >90 mL/min   GFR calc Af Amer >90  >90 mL/min   Comment: (NOTE)     The eGFR has been calculated using the CKD EPI equation.     This calculation has not been validated in all clinical situations.     eGFR's persistently <90 mL/min signify possible Chronic Kidney     Disease.  LIPASE, BLOOD     Status: None   Collection Time    08/10/13  2:20 PM      Result Value Ref Range   Lipase 12  11 - 59 U/L  ACETAMINOPHEN LEVEL     Status: None   Collection Time    08/10/13  2:20 PM      Result Value Ref Range   Acetaminophen (Tylenol), Serum <15.0  10 - 30 ug/mL   Comment:            THERAPEUTIC CONCENTRATIONS VARY     SIGNIFICANTLY. A RANGE OF 10-30     ug/mL MAY BE AN EFFECTIVE     CONCENTRATION FOR MANY PATIENTS.     HOWEVER, SOME ARE BEST TREATED     AT CONCENTRATIONS OUTSIDE THIS     RANGE.     ACETAMINOPHEN CONCENTRATIONS     >150 ug/mL AT 4 HOURS AFTER     INGESTION AND >50 ug/mL AT 12     HOURS AFTER INGESTION ARE     OFTEN ASSOCIATED WITH TOXIC     REACTIONS.  ETHANOL     Status: None   Collection Time    08/10/13  2:20 PM      Result Value Ref Range   Alcohol, Ethyl (B) <11  0 - 11 mg/dL   Comment:            LOWEST DETECTABLE LIMIT FOR     SERUM ALCOHOL IS  11 mg/dL     FOR MEDICAL PURPOSES ONLY  CK     Status: None   Collection Time    08/10/13  2:20 PM      Result Value Ref Range   Total CK 117  7 - 232 U/L  HIV ANTIBODY (ROUTINE TESTING)     Status: None   Collection Time    08/10/13  2:20 PM      Result Value Ref Range   HIV NON REACTIVE  NON REACTIVE   Comment: Performed at [facility], ED     Status: None   Collection Time    08/10/13  3:01 PM      Result Value Ref Range   Sodium 145  137 - 147 mEq/L   Potassium 3.8  3.7 - 5.3 mEq/L   Chloride 101  96 - 112 mEq/L   BUN 16  6 - 23 mg/dL   Creatinine, Ser 0.70  0.50 - 1.35 mg/dL   Glucose, Bld 95  70 - 99 mg/dL    Calcium, Ion 1.19  1.12 - 1.23 mmol/L   TCO2 32  0 - 100 mmol/L   Hemoglobin 15.6  13.0 - 17.0 g/dL   HCT 46.0  39.0 - 52.0 %  I-STAT CG4 LACTIC ACID, ED     Status: None   Collection Time    08/10/13  3:02 PM      Result Value Ref Range   Lactic Acid, Venous 1.48  0.5 - 2.2 mmol/L  MRSA PCR SCREENING     Status: None   Collection Time    08/10/13  6:50 PM      Result Value Ref Range   MRSA by PCR NEGATIVE  NEGATIVE   Comment:            The GeneXpert MRSA Assay (FDA     approved for NASAL specimens     only), is one component of a     comprehensive MRSA colonization     surveillance program. It is not     intended to diagnose MRSA     infection nor to guide or     monitor treatment for     MRSA infections.  URINE RAPID DRUG SCREEN (HOSP PERFORMED)     Status: None   Collection Time    08/10/13  8:18 PM      Result Value Ref Range   Opiates NONE DETECTED  NONE DETECTED   Cocaine NONE DETECTED  NONE DETECTED   Benzodiazepines NONE DETECTED  NONE DETECTED   Amphetamines NONE DETECTED  NONE DETECTED   Tetrahydrocannabinol NONE DETECTED  NONE DETECTED   Barbiturates NONE DETECTED  NONE DETECTED   Comment:            DRUG SCREEN FOR MEDICAL PURPOSES     ONLY.  IF CONFIRMATION IS NEEDED     FOR ANY PURPOSE, NOTIFY LAB     WITHIN 5 DAYS.                LOWEST DETECTABLE LIMITS     FOR URINE DRUG SCREEN     Drug Class       Cutoff (ng/mL)     Amphetamine      1000     Barbiturate      200     Benzodiazepine   741     Tricyclics       638     Opiates  300     Cocaine          300     THC              50  LEGIONELLA ANTIGEN, URINE     Status: None   Collection Time    08/10/13  8:18 PM      Result Value Ref Range   Specimen Description URINE, CATHETERIZED     Special Requests NONE     Legionella Antigen, Urine       Value: Negative for Legionella pneumophilia serogroup 1     Performed at [facility]   Report Status 08/11/2013 FINAL      Dg Chest 2  View  08/10/2013   CLINICAL DATA:  Clinical dehydration  EXAM: CHEST  2 VIEW  COMPARISON:  DG CHEST 2V dated 08/01/2013  FINDINGS: The lungs are well-expanded. There is new mildly increased density adjacent to the cardiac apex on the frontal film which projects in the retrocardiac region on the lateral film. There is no pleural effusion or pneumothorax. The cardiac silhouette is normal in size. The pulmonary vascularity is not engorged. The mediastinum is normal in width. The observed portions of the bony thorax appear normal.  IMPRESSION: There is minimal subsegmental atelectasis or early pneumonia in the anterior aspect of the left lower lobe. Elsewhere the lung parenchyma appears clear. There is no evidence of CHF.   Electronically Signed   By: David  Martinique   On: 08/10/2013 16:29   Ct Soft Tissue Neck W Contrast  08/11/2013   CLINICAL DATA:  Severe dysphagia.  Difficulty swallowing.  EXAM: CT NECK WITH CONTRAST  TECHNIQUE: Multidetector CT imaging of the neck was performed using the standard protocol following the bolus administration of intravenous contrast.  CONTRAST:  50m OMNIPAQUE IOHEXOL 300 MG/ML  SOLN  COMPARISON:  None.  FINDINGS: Visualized intracranial contents, orbits, and sinuses are normal.  The tongue is normal. The tonsils are symmetric. Negative for peritonsillar abscess or mass. The epiglottis and larynx are normal.  The thyroid is normal. Parotid and submandibular glands are normal bilaterally.  Negative for adenopathy in the neck.  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 No acute bony abnormality. Dental caries lower molars bilaterally.  IMPRESSION: No acute abnormality.  Negative for mass or adenopathy in the neck.   Electronically Signed   By: CFranchot GalloM.D.   On: 08/11/2013 09:05              Blood pressure 138/86, pulse 94, temperature 98.3 F (36.8 C), temperature source Oral, resp. rate 16, height 6' 2"  (1.88 m), weight 69.355 kg (152 lb 14.4 oz), SpO2 99.00%.  Physical exam:    General-- thin white male who has a dQuarry managerin the room and is handcuffed to the bed  Mouth -- obvious ulcerations on the lips and tongue the patient is able to talk. Heart-- regular rate and rhythm without murmurs are gallops  Lungs--clear Abdomen-- soft and nontender  Assessment: 1. Odynophagia. Patient has severe alterations of the mouth and tongue and I suspect probably the esophagus as well. CMV, herpes all considerations. He is HIV-negative  Plan: we will proceed tomorrow with EGD and likely obtain viral cultures for herpes and CMV 50s lesions persist in his esophagus. His hair limited his oropharynx will likely need ENT to get involved.   Demontae Antunes JR,Zareah Hunzeker L 08/11/2013, 12:59 PM

## 2013-08-11 NOTE — Progress Notes (Addendum)
Patient Demographics  Chase Bush, is a 33 y.o. male, DOB - [birth year], ZOX:[id number]  Admit date - 08/10/2013   Admitting Physician Marinda Elk, MD  Outpatient Primary MD for the patient is No PCP Per Patient  LOS - 1   Chief Complaint  Patient presents with  . Dehydration        Assessment & Plan    1. Aspiration pneumonia - on appropriate antibiotic and doing better, nebulizer treatments oxygen as needed, aspiration precautions, speech following.    2. Odynophagia. Does have ulceration on his right buccal mucosa, he continues to have immense pain and discomfort in his throat, I question if he has oral mucosal infection like herpes versus gonorrhea. Have requested GI to evaluate and consider possible EGD to obtain a better visualization and possible guided culture, continue Magic mouthwash with lidocaine for now. Aspiration precautions. Speech following.   There is a small possibility that his dysphagia and drooling could be part of his extensive psych medication regimen recently started which includes Cogentin, Zyprexa, Haldol. However it does not explain the odynophagia.     3. Dehydration. Gentle IV fluids.     4.Mod PCM -protein supplementation.     5.? Why the patient was on high doses of Zyprexa, Cogentin and Haldol. Discussed with present are in the fall so who informs me that these medications were started while he was in the present to minimize his narcotic and benzo withdrawal. These are not his chronic medications. Will get psych to evaluate need for these medications, for now we'll discontinue Cogentin and Haldol. Continue low-dose Zyprexa.     6.Narcotic and Benzo abuser, ?  Alcohol use - no signs of DTs, on Ativan dose reduced, and folic acid thiamine.  Counseled to quit alcohol. D/W Contractor. Low dose Ativan scheduled as per regimen from prison (chronic)      7. Reported muscle rigidity in the ER. Improved after Haldol dose reduced. For now I have stopped Cogentin and Haldol completely. Low-dose Zyprexa to continue.      Code Status: Full  Family Communication: Psychologist, counselling phone number [phone]  Disposition Plan: Prison   Procedures CT soft tissue neck unremarkable   Consults  GI for possible EGD, Psych   Medications  Scheduled Meds: . ampicillin-sulbactam (UNASYN) IV  1.5 g Intravenous Q6H  . azithromycin  500 mg Intravenous Q24H  . benztropine  1 mg Oral TID  . chlorhexidine  15 mL Mouth Rinse BID  . feeding supplement (ENSURE)  1 Container Oral BID BM  . folic acid  1 mg Oral Daily  . haloperidol  1 mg Oral TID  . heparin  5,000 Units Subcutaneous 3 times per day  . LORazepam  1 mg Oral TID  . magic mouthwash w/lidocaine  5 mL Oral Q4H  . nystatin  5 mL Oral QID  . OLANZapine  10 mg Oral QHS  . thiamine  100 mg Oral Daily   Continuous Infusions: . sodium chloride 1,000 mL (08/10/13 1900)   PRN Meds:.RESOURCE THICKENUP CLEAR, white petrolatum  DVT Prophylaxis    Heparin   Lab Results  Component Value Date   PLT 360 08/10/2013    Antibiotics     Anti-infectives  Start     Dose/Rate Route Frequency Ordered Stop   08/10/13 1930  ampicillin-sulbactam (UNASYN) 1.5 g in sodium chloride 0.9 % 50 mL IVPB     1.5 g 100 mL/hr over 30 Minutes Intravenous Every 6 hours 08/10/13 1832     08/10/13 1930  azithromycin (ZITHROMAX) 500 mg in dextrose 5 % 250 mL IVPB     500 mg 250 mL/hr over 60 Minutes Intravenous Every 24 hours 08/10/13 1832            Subjective:   Chase Bush today has, No headache, No chest pain, No abdominal pain - No Nausea, No new weakness tingling or numbness, No Cough - SOB. Continues to complain of odynophagia, points to his right side of the mouth where he says it  hurts, also says it hurts in his throat while swallowing. He claims this started about a week ago.  Objective:   Filed Vitals:   08/10/13 1837 08/10/13 2117 08/11/13 0300 08/11/13 0900  BP: 152/82 150/86 138/86   Pulse: 66 76 94   Temp: 97.8 F (36.6 C) 98.1 F (36.7 C) 98.3 F (36.8 C)   TempSrc: Oral Oral Oral   Resp: 16 16 16    Height:    6\' 2"  (1.88 m)  Weight:    69.355 kg (152 lb 14.4 oz)  SpO2: 97% 98% 99%     Wt Readings from Last 3 Encounters:  08/11/13 69.355 kg (152 lb 14.4 oz)     Intake/Output Summary (Last 24 hours) at 08/11/13 1130 Last data filed at 08/11/13 1045  Gross per 24 hour  Intake    110 ml  Output   2500 ml  Net  -2390 ml     Physical Exam  Awake Alert, Oriented X 3, No new F.N deficits, Normal affect [place].AT,PERRAL Patient sitting with mouth open, drooling, has poor dentition, superficial lip ulceration on the left side, has a shallow ulcer on the right buccal mucosa, unable to visualize his throat as patient does not open his mouth. Pain is the limiting factor. Supple Neck,No JVD, No cervical lymphadenopathy appriciated.  Symmetrical Chest wall movement, Good air movement bilaterally, CTAB RRR,No Gallops,Rubs or new Murmurs, No Parasternal Heave +ve B.Sounds, Abd Soft, Non tender, No organomegaly appriciated, No rebound - guarding or rigidity. No Cyanosis, Clubbing or edema, No new Rash or bruise      Data Review   Micro Results Recent Results (from the past 240 hour(s))  MRSA PCR SCREENING     Status: None   Collection Time    08/10/13  6:50 PM      Result Value Ref Range Status   MRSA by PCR NEGATIVE  NEGATIVE Final   Comment:            The GeneXpert MRSA Assay (FDA     approved for NASAL specimens     only), is one component of a     comprehensive MRSA colonization     surveillance program. It is not     intended to diagnose MRSA     infection nor to guide or     monitor treatment for     MRSA infections.    Radiology  Reports Dg Chest 2 View  08/10/2013   CLINICAL DATA:  Clinical dehydration  EXAM: CHEST  2 VIEW  COMPARISON:  DG CHEST 2V dated 08/01/2013  FINDINGS: The lungs are well-expanded. There is new mildly increased density adjacent to the cardiac apex on the frontal film which  projects in the retrocardiac region on the lateral film. There is no pleural effusion or pneumothorax. The cardiac silhouette is normal in size. The pulmonary vascularity is not engorged. The mediastinum is normal in width. The observed portions of the bony thorax appear normal.  IMPRESSION: There is minimal subsegmental atelectasis or early pneumonia in the anterior aspect of the left lower lobe. Elsewhere the lung parenchyma appears clear. There is no evidence of CHF.   Electronically Signed   By: David  [place]   On: 08/10/2013 16:29   Ct Soft Tissue Neck W Contrast  08/11/2013   CLINICAL DATA:  Severe dysphagia.  Difficulty swallowing.  EXAM: CT NECK WITH CONTRAST  TECHNIQUE: Multidetector CT imaging of the neck was performed using the standard protocol following the bolus administration of intravenous contrast.  CONTRAST:  75mL OMNIPAQUE IOHEXOL 300 MG/ML  SOLN  COMPARISON:  None.  FINDINGS: Visualized intracranial contents, orbits, and sinuses are normal.  The tongue is normal. The tonsils are symmetric. Negative for peritonsillar abscess or mass. The epiglottis and larynx are normal.  The thyroid is normal. Parotid and submandibular glands are normal bilaterally.  Negative for adenopathy in the neck.  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 No acute bony abnormality. Dental caries lower molars bilaterally.  IMPRESSION: No acute abnormality.  Negative for mass or adenopathy in the neck.   Electronically Signed   By: Marlan Palauharles  Clark M.D.   On: 08/11/2013 09:05    CBC  Recent Labs Lab 08/10/13 1420 08/10/13 1501  WBC 12.2*  --   HGB 15.2 15.6  HCT 43.9 46.0  PLT 360  --   MCV 89.6  --   MCH 31.0  --   MCHC 34.6  --   RDW 13.5  --     LYMPHSABS 2.0  --   MONOABS 0.8  --   EOSABS 0.1  --   BASOSABS 0.0  --     Chemistries   Recent Labs Lab 08/10/13 1420 08/10/13 1501  NA 147 145  K 4.0 3.8  CL 103 101  CO2 29  --   GLUCOSE 95 95  BUN 16 16  CREATININE 0.63 0.70  CALCIUM 9.5  --   AST 15  --   ALT 15  --   ALKPHOS 78  --   BILITOT 0.7  --    ------------------------------------------------------------------------------------------------------------------ estimated creatinine clearance is 128.9 ml/min (by C-G formula based on Cr of 0.7). ------------------------------------------------------------------------------------------------------------------ No results found for this basename: HGBA1C,  in the last 72 hours ------------------------------------------------------------------------------------------------------------------ No results found for this basename: CHOL, HDL, LDLCALC, TRIG, CHOLHDL, LDLDIRECT,  in the last 72 hours ------------------------------------------------------------------------------------------------------------------ No results found for this basename: TSH, T4TOTAL, FREET3, T3FREE, THYROIDAB,  in the last 72 hours ------------------------------------------------------------------------------------------------------------------ No results found for this basename: VITAMINB12, FOLATE, FERRITIN, TIBC, IRON, RETICCTPCT,  in the last 72 hours  Coagulation profile No results found for this basename: INR, PROTIME,  in the last 168 hours  No results found for this basename: DDIMER,  in the last 72 hours  Cardiac Enzymes No results found for this basename: CK, CKMB, TROPONINI, MYOGLOBIN,  in the last 168 hours ------------------------------------------------------------------------------------------------------------------ No components found with this basename: POCBNP,      Time Spent in minutes   35   Philisha Weinel K M.D on 08/11/2013 at 11:30 AM  Between 7am to 7pm - Pager -  [phone]  After 7pm go to [email or web address] - password [email or web address]  And look for the night coverage person covering for me after hours  [place]  [phone]

## 2013-08-11 NOTE — Consult Note (Signed)
[facility] Face-to-Face Psychiatry Consult   Reason for Consult:  Psychosis Referring Physician:  Dr Jillene Bucks Mellinger is an 33 y.o. male. Total Time spent with patient: 30 minutes  Assessment: AXIS I:  Substance Abuse and Substance Induced Mood Disorder AXIS II:  Deferred AXIS III:   Past Medical History  Diagnosis Date  . Substance abuse     was using suboxone until Jan 3rd. also hx of benzo abuse.   . Pneumonia     "when I was little; got it again now" (08/10/2013)   AXIS IV:  economic problems, other psychosocial or environmental problems, problems related to social environment, problems with access to health care services and problems with primary support group AXIS V:  51-60 moderate symptoms  Plan:  No evidence of imminent risk to self or others at present.   Patient does not meet criteria for psychiatric inpatient admission. Supportive therapy provided about ongoing stressors. Discussed crisis plan, support from social network, calling 911, coming to the Emergency Department, and calling Suicide Hotline.  Subjective:   Zinedine Ellner is a 33 y.o. male patient admitted with unable to swallow .    HPI:  Patient seen chart reviewed.  Patient is 33 year old Caucasian man who was sent from the prison because patient was spitting up and he could not swallow.  He is been complaining of June, difficulty in talking and having tremors in his upper extremities and lack of ability to do his ADLs.  He was also coughing.  Consult was called because patient was taking Joycelyn Schmid goes off psychotropic medication.  Currently he is taking Zyprexa.  However he has been taking Haldol and Cogentin.  Patient told these medications were started when he was in the jail because he had history of using cocaine, heroin and marijuana.  He also endorsed history of auditory hallucinations, worsening and anger.  He is incarcerated for 45 days because of breaking and larceny.  The patient is a poor historian.   He has difficulty in talking.  He has doing and he appears to be very anxious and difficult to concentrate.  His getting antibiotic.  Patient denies any history of psychiatric inpatient treatment or any previous suicidal attempt.  He admitted some time paranoia, hallucination and poor sleep.  He has legal problems and has been sent to jail in the past.  The patient reported his psychotropic medication does help hallucinations and insomnia.  Patient denies any suicidal thoughts or homicidal thoughts.  He denies any aggression, violence at this time.  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 HPI Elements:   Location:  Difficulty in swallowing, drooling,. Quality:  Mild. Severity:  Mild.  Past Psychiatric History: Past Medical History  Diagnosis Date  . Substance abuse     was using suboxone until Jan 3rd. also hx of benzo abuse.   . Pneumonia     "when I was little; got it again now" (08/10/2013)    reports that he has been smoking Cigarettes.  He has a 21 pack-year smoking history. He has never used smokeless tobacco. He reports that he uses illicit drugs. He reports that he does not drink alcohol. History reviewed. No pertinent family history.       Abuse/Neglect [facility]) Physical Abuse: Denies Verbal Abuse: Denies Sexual Abuse: Denies Allergies:  No Known Allergies  ACT Assessment Complete:  Yes:    Educational Status    Risk to Self: Risk to self Is patient  at risk for suicide?: No Substance abuse history and/or treatment for substance abuse?: Yes  Risk to Others:    Abuse: Abuse/Neglect Assessment (Assessment to be complete while patient is alone) Physical Abuse: Denies Verbal Abuse: Denies Sexual Abuse: Denies Exploitation of patient/patient's resources: Denies Self-Neglect: Denies  Prior Inpatient Therapy:    Prior Outpatient Therapy:    Additional Information:                    Objective: Blood pressure 131/78, pulse 72,  temperature 97.4 F (36.3 C), temperature source Oral, resp. rate 16, height 6' 2"  (1.88 m), weight 152 lb 14.4 oz (69.355 kg), SpO2 97.00%.Body mass index is 19.62 kg/(m^2). Results for orders placed during the hospital encounter of 08/10/13 (from the past 72 hour(s))  CBG MONITORING, ED     Status: None   Collection Time    08/10/13  1:59 PM      Result Value Ref Range   Glucose-Capillary 90  70 - 99 mg/dL  CBC WITH DIFFERENTIAL     Status: Abnormal   Collection Time    08/10/13  2:20 PM      Result Value Ref Range   WBC 12.2 (*) 4.0 - 10.5 K/uL   RBC 4.90  4.22 - 5.81 MIL/uL   Hemoglobin 15.2  13.0 - 17.0 g/dL   HCT 43.9  39.0 - 52.0 %   MCV 89.6  78.0 - 100.0 fL   MCH 31.0  26.0 - 34.0 pg   MCHC 34.6  30.0 - 36.0 g/dL   RDW 13.5  11.5 - 15.5 %   Platelets 360  150 - 400 K/uL   Neutrophils Relative % 76  43 - 77 %   Neutro Abs 9.2 (*) 1.7 - 7.7 K/uL   Lymphocytes Relative 17  12 - 46 %   Lymphs Abs 2.0  0.7 - 4.0 K/uL   Monocytes Relative 6  3 - 12 %   Monocytes Absolute 0.8  0.1 - 1.0 K/uL   Eosinophils Relative 1  0 - 5 %   Eosinophils Absolute 0.1  0.0 - 0.7 K/uL   Basophils Relative 0  0 - 1 %   Basophils Absolute 0.0  0.0 - 0.1 K/uL  COMPREHENSIVE METABOLIC PANEL     Status: None   Collection Time    08/10/13  2:20 PM      Result Value Ref Range   Sodium 147  137 - 147 mEq/L   Potassium 4.0  3.7 - 5.3 mEq/L   Chloride 103  96 - 112 mEq/L   CO2 29  19 - 32 mEq/L   Glucose, Bld 95  70 - 99 mg/dL   BUN 16  6 - 23 mg/dL   Creatinine, Ser 0.63  0.50 - 1.35 mg/dL   Calcium 9.5  8.4 - 10.5 mg/dL   Total Protein 7.5  6.0 - 8.3 g/dL   Albumin 4.0  3.5 - 5.2 g/dL   AST 15  0 - 37 U/L   ALT 15  0 - 53 U/L   Alkaline Phosphatase 78  39 - 117 U/L   Total Bilirubin 0.7  0.3 - 1.2 mg/dL   GFR calc non Af Amer >90  >90 mL/min   GFR calc Af Amer >90  >90 mL/min   Comment: (NOTE)     The eGFR has been calculated using the CKD EPI equation.     This calculation has not been  validated in all clinical situations.  eGFR's persistently <90 mL/min signify possible Chronic Kidney     Disease.  LIPASE, BLOOD     Status: None   Collection Time    08/10/13  2:20 PM      Result Value Ref Range   Lipase 12  11 - 59 U/L  ACETAMINOPHEN LEVEL     Status: None   Collection Time    08/10/13  2:20 PM      Result Value Ref Range   Acetaminophen (Tylenol), Serum <15.0  10 - 30 ug/mL   Comment:            THERAPEUTIC CONCENTRATIONS VARY     SIGNIFICANTLY. A RANGE OF 10-30     ug/mL MAY BE AN EFFECTIVE     CONCENTRATION FOR MANY PATIENTS.     HOWEVER, SOME ARE BEST TREATED     AT CONCENTRATIONS OUTSIDE THIS     RANGE.     ACETAMINOPHEN CONCENTRATIONS     >150 ug/mL AT 4 HOURS AFTER     INGESTION AND >50 ug/mL AT 12     HOURS AFTER INGESTION ARE     OFTEN ASSOCIATED WITH TOXIC     REACTIONS.  ETHANOL     Status: None   Collection Time    08/10/13  2:20 PM      Result Value Ref Range   Alcohol, Ethyl (B) <11  0 - 11 mg/dL   Comment:            LOWEST DETECTABLE LIMIT FOR     SERUM ALCOHOL IS 11 mg/dL     FOR MEDICAL PURPOSES ONLY  CK     Status: None   Collection Time    08/10/13  2:20 PM      Result Value Ref Range   Total CK 117  7 - 232 U/L  HIV ANTIBODY (ROUTINE TESTING)     Status: None   Collection Time    08/10/13  2:20 PM      Result Value Ref Range   HIV NON REACTIVE  NON REACTIVE   Comment: Performed at [facility], ED     Status: None   Collection Time    08/10/13  3:01 PM      Result Value Ref Range   Sodium 145  137 - 147 mEq/L   Potassium 3.8  3.7 - 5.3 mEq/L   Chloride 101  96 - 112 mEq/L   BUN 16  6 - 23 mg/dL   Creatinine, Ser 0.70  0.50 - 1.35 mg/dL   Glucose, Bld 95  70 - 99 mg/dL   Calcium, Ion 1.19  1.12 - 1.23 mmol/L   TCO2 32  0 - 100 mmol/L   Hemoglobin 15.6  13.0 - 17.0 g/dL   HCT 46.0  39.0 - 52.0 %  I-STAT CG4 LACTIC ACID, ED     Status: None   Collection Time    08/10/13  3:02 PM      Result  Value Ref Range   Lactic Acid, Venous 1.48  0.5 - 2.2 mmol/L  MRSA PCR SCREENING     Status: None   Collection Time    08/10/13  6:50 PM      Result Value Ref Range   MRSA by PCR NEGATIVE  NEGATIVE   Comment:            The GeneXpert MRSA Assay (FDA     approved for NASAL specimens     only),  is one component of a     comprehensive MRSA colonization     surveillance program. It is not     intended to diagnose MRSA     infection nor to guide or     monitor treatment for     MRSA infections.  URINE RAPID DRUG SCREEN (HOSP PERFORMED)     Status: None   Collection Time    08/10/13  8:18 PM      Result Value Ref Range   Opiates NONE DETECTED  NONE DETECTED   Cocaine NONE DETECTED  NONE DETECTED   Benzodiazepines NONE DETECTED  NONE DETECTED   Amphetamines NONE DETECTED  NONE DETECTED   Tetrahydrocannabinol NONE DETECTED  NONE DETECTED   Barbiturates NONE DETECTED  NONE DETECTED   Comment:            DRUG SCREEN FOR MEDICAL PURPOSES     ONLY.  IF CONFIRMATION IS NEEDED     FOR ANY PURPOSE, NOTIFY LAB     WITHIN 5 DAYS.                LOWEST DETECTABLE LIMITS     FOR URINE DRUG SCREEN     Drug Class       Cutoff (ng/mL)     Amphetamine      1000     Barbiturate      200     Benzodiazepine   102     Tricyclics       725     Opiates          300     Cocaine          300     THC              50  LEGIONELLA ANTIGEN, URINE     Status: None   Collection Time    08/10/13  8:18 PM      Result Value Ref Range   Specimen Description URINE, CATHETERIZED     Special Requests NONE     Legionella Antigen, Urine       Value: Negative for Legionella pneumophilia serogroup 1     Performed at [facility]   Report Status 08/11/2013 FINAL     Labs are reviewed.  Current Facility-Administered Medications  Medication Dose Route Frequency Provider Last Rate Last Dose  . 0.9 %  sodium chloride infusion   Intravenous Continuous Charlynne Cousins, MD 100 mL/hr at 08/10/13 1900  1,000 mL at 08/10/13 1900  . ampicillin-sulbactam (UNASYN) 1.5 g in sodium chloride 0.9 % 50 mL IVPB  1.5 g Intravenous Q6H Charlynne Cousins, MD   1.5 g at 08/11/13 1349  . azithromycin (ZITHROMAX) 500 mg in dextrose 5 % 250 mL IVPB  500 mg Intravenous Q24H Charlynne Cousins, MD   500 mg at 08/10/13 1930  . chlorhexidine (PERIDEX) 0.12 % solution 15 mL  15 mL Mouth Rinse BID Charlynne Cousins, MD   15 mL at 08/11/13 0759  . feeding supplement (ENSURE) (ENSURE) pudding 1 Container  1 Container Oral BID BM Erlene Quan, RD      . folic acid (FOLVITE) tablet 1 mg  1 mg Oral Daily Thurnell Lose, MD   1 mg at 08/11/13 1350  . heparin injection 5,000 Units  5,000 Units Subcutaneous 3 times per day Charlynne Cousins, MD   5,000 Units at 08/11/13 1350  . LORazepam (ATIVAN) tablet 1 mg  1  mg Oral TID Thurnell Lose, MD      . magic mouthwash w/lidocaine  5 mL Oral Q4H Thurnell Lose, MD   5 mL at 08/11/13 1351  . nystatin (MYCOSTATIN) 100000 UNIT/ML suspension 500,000 Units  5 mL Oral QID Jeryl Columbia, NP   500,000 Units at 08/11/13 1350  . OLANZapine (ZYPREXA) tablet 10 mg  10 mg Oral QHS Charlynne Cousins, MD   10 mg at 08/10/13 2135  . RESOURCE THICKENUP CLEAR   Oral PRN Thurnell Lose, MD      . thiamine (VITAMIN B-1) tablet 100 mg  100 mg Oral Daily Thurnell Lose, MD   100 mg at 08/11/13 1349  . white petrolatum (VASELINE) gel   Topical PRN Sinda Du, MD   0.2 application at 17/61/60 1537    Psychiatric Specialty Exam:     Blood pressure 131/78, pulse 72, temperature 97.4 F (36.3 C), temperature source Oral, resp. rate 16, height 6' 2"  (1.88 m), weight 152 lb 14.4 oz (69.355 kg), SpO2 97.00%.Body mass index is 19.62 kg/(m^2).  General Appearance: Disheveled, Guarded and Poor historian and difficulty in talking  Eye Contact::  Fair  Speech:  Blocked and Slurred  Volume:  Decreased  Mood:  Anxious  Affect:  Blunt  Thought Process:  Loose  Orientation:  Full  (Time, Place, and Person)  Thought Content:  Rumination  Suicidal Thoughts:  No  Homicidal Thoughts:  No  Memory:  Immediate;   Poor Recent;   Fair Remote;   Poor  Judgement:  Fair  Insight:  Fair  Psychomotor Activity:  Decreased  Concentration:  Fair  Recall:  [employer] of Knowledge:Poor  Language: Fair  Akathisia:  No  Handed:  Right  AIMS (if indicated):     Assets:  Desire for Improvement  Sleep:      Musculoskeletal: Strength & Muscle Tone: Tremors Gait & Station: Patient is lying on the bed, handcuffs Patient leans: N/A  Treatment Plan Summary: Medication management, patient is given psychotropic medication for hallucination and he has a history of using cocaine heroine and marijuana.  It is unlikely that psychotropic medication causing above symptoms however Consider Seroquel 100 mg at bedtime and discontinued the olanzapine if everything rule out in his condition is still persist.  Please call [phone] if you have any further questions.  Shashana Fullington T. 08/11/2013 4:34 PM

## 2013-08-11 NOTE — ED Provider Notes (Signed)
I saw and evaluated the patient, reviewed the resident's note and I agree with the findings and plan.   Patient with altered mental status. Dehydration. Unable to tolerate orals. Will admit.  Juliet RudeNathan R. Rubin PayorPickering, MD 08/11/13 [phone]

## 2013-08-12 ENCOUNTER — Inpatient Hospital Stay (HOSPITAL_COMMUNITY)

## 2013-08-12 ENCOUNTER — Inpatient Hospital Stay (HOSPITAL_COMMUNITY): Admitting: Anesthesiology

## 2013-08-12 ENCOUNTER — Encounter (HOSPITAL_COMMUNITY): Admission: EM | Payer: Self-pay | Source: Home / Self Care | Attending: Internal Medicine

## 2013-08-12 ENCOUNTER — Encounter (HOSPITAL_COMMUNITY): Payer: Self-pay | Admitting: Anesthesiology

## 2013-08-12 ENCOUNTER — Encounter (HOSPITAL_COMMUNITY): Admitting: Anesthesiology

## 2013-08-12 DIAGNOSIS — F1999 Other psychoactive substance use, unspecified with unspecified psychoactive substance-induced disorder: Secondary | ICD-10-CM

## 2013-08-12 DIAGNOSIS — R1310 Dysphagia, unspecified: Secondary | ICD-10-CM

## 2013-08-12 HISTORY — PX: ESOPHAGOGASTRODUODENOSCOPY (EGD) WITH PROPOFOL: SHX5813

## 2013-08-12 LAB — PHOSPHORUS: Phosphorus: 3.6 mg/dL (ref 2.3–4.6)

## 2013-08-12 LAB — POTASSIUM: Potassium: 3.4 mEq/L — ABNORMAL LOW (ref 3.7–5.3)

## 2013-08-12 LAB — MAGNESIUM: Magnesium: 1.9 mg/dL (ref 1.5–2.5)

## 2013-08-12 SURGERY — ESOPHAGOGASTRODUODENOSCOPY (EGD) WITH PROPOFOL
Anesthesia: General

## 2013-08-12 MED ORDER — LACTATED RINGERS IV SOLN
INTRAVENOUS | Status: DC | PRN
  Administered 2013-08-12: 08:00:00 via INTRAVENOUS

## 2013-08-12 MED ORDER — FENTANYL CITRATE 0.05 MG/ML IJ SOLN
INTRAMUSCULAR | Status: DC | PRN
  Administered 2013-08-12: 50 ug via INTRAVENOUS

## 2013-08-12 MED ORDER — OXYCODONE HCL 5 MG PO TABS: 5.0000 mg | ORAL_TABLET | Freq: Once | ORAL | Status: DC | PRN

## 2013-08-12 MED ORDER — FENTANYL CITRATE 0.05 MG/ML IJ SOLN
25.0000 ug | INTRAMUSCULAR | Status: DC | PRN
Start: 2013-08-12 — End: 2013-08-12

## 2013-08-12 MED ORDER — SODIUM CHLORIDE 0.9 % IV SOLN: INTRAVENOUS | Status: DC

## 2013-08-12 MED ORDER — OXYCODONE HCL 5 MG/5ML PO SOLN: 5.0000 mg | Freq: Once | ORAL | Status: DC | PRN

## 2013-08-12 MED ORDER — PROPOFOL 10 MG/ML IV BOLUS
INTRAVENOUS | Status: DC | PRN
  Administered 2013-08-12: 240 mg via INTRAVENOUS

## 2013-08-12 MED ORDER — DEXTROSE 5 % IV SOLN
5.0000 mg/kg | Freq: Three times a day (TID) | INTRAVENOUS | Status: DC
  Administered 2013-08-13 – 2013-08-16 (×10): 345 mg via INTRAVENOUS
  Filled 2013-08-12 (×18): qty 6.9

## 2013-08-12 MED ORDER — LACTATED RINGERS IV SOLN
INTRAVENOUS | Status: DC
Start: 2013-08-12 — End: 2013-08-12
  Administered 2013-08-12: 08:00:00 via INTRAVENOUS

## 2013-08-12 MED ORDER — FLUCONAZOLE IN SODIUM CHLORIDE 200-0.9 MG/100ML-% IV SOLN
200.0000 mg | Freq: Once | INTRAVENOUS | Status: AC
  Administered 2013-08-12: 200 mg via INTRAVENOUS
  Filled 2013-08-12: qty 100

## 2013-08-12 MED ORDER — FLUCONAZOLE 100MG IVPB
100.0000 mg | INTRAVENOUS | Status: DC
  Administered 2013-08-13 – 2013-08-15 (×3): 100 mg via INTRAVENOUS
  Filled 2013-08-12 (×4): qty 50

## 2013-08-12 MED ORDER — ONDANSETRON HCL 4 MG/2ML IJ SOLN
INTRAMUSCULAR | Status: DC | PRN
  Administered 2013-08-12: 4 mg via INTRAVENOUS

## 2013-08-12 MED ORDER — ACYCLOVIR 200 MG PO CAPS
200.0000 mg | ORAL_CAPSULE | Freq: Every day | ORAL | Status: DC
  Administered 2013-08-12: 200 mg via ORAL
  Filled 2013-08-12 (×4): qty 1

## 2013-08-12 MED ORDER — SUCCINYLCHOLINE CHLORIDE 20 MG/ML IJ SOLN
INTRAMUSCULAR | Status: DC | PRN
  Administered 2013-08-12: 120 mg via INTRAVENOUS

## 2013-08-12 MED ORDER — LIDOCAINE HCL (CARDIAC) 20 MG/ML IV SOLN
INTRAVENOUS | Status: DC | PRN
  Administered 2013-08-12: 60 mg via INTRAVENOUS

## 2013-08-12 MED ORDER — METOCLOPRAMIDE HCL 5 MG/ML IJ SOLN: 10.0000 mg | Freq: Once | INTRAMUSCULAR | Status: DC | PRN

## 2013-08-12 NOTE — H&P (View-Only) (Signed)
[facility] CONS[facility] Reason for consult: Odynophagia Referring Physician: [facility]  Chase Bush is an 33 y.o. male.  HPI: he has been incarcerated at the [facility] detention center and got to the point where he was unable to swallow his medications or food due to severe pain. He was having difficulty speaking and was drooling. He has a past history of psychiatric disorders. He was noted in the ER on a mission to have severe oral ulcerations. The patient reports to me that he has been incarcerated for 45 days and up until 10 days ago was able to eat and drink without any problems. He's never had EGD denies any history of chronic heartburn. He does have a history of substance abuse.  Past Medical History  Diagnosis Date  . Substance abuse     was using suboxone until Jan 3rd. also hx of benzo abuse.   . Pneumonia     "when I was little; got it again now" (08/10/2013)    Past Surgical History  Procedure Laterality Date  . No past surgeries      History reviewed. No pertinent family history.  Social History:  reports that he has been smoking Cigarettes.  He has a 21 pack-year smoking history. He has never used smokeless tobacco. He reports that he uses illicit drugs. He reports that he does not drink alcohol.  Allergies: No Known Allergies  Medications; Prior to Admission medications   Medication Sig Start Date End Date Taking? Authorizing Provider  benztropine (COGENTIN) 1 MG tablet Take 1 mg by mouth 3 (three) times daily.   Yes Historical Provider, MD  haloperidol (HALDOL) 10 MG tablet Take 10 mg by mouth 3 (three) times daily.   Yes Historical Provider, MD  LORazepam (ATIVAN) 2 MG tablet Take 2 mg by mouth 3 (three) times daily.   Yes Historical Provider, MD  OLANZapine (ZYPREXA) 10 MG tablet Take 10 mg by mouth at bedtime.   Yes Historical Provider, MD   . ampicillin-sulbactam (UNASYN) IV  1.5 g Intravenous Q6H  . azithromycin  500 mg Intravenous Q24H  .  chlorhexidine  15 mL Mouth Rinse BID  . feeding supplement (ENSURE)  1 Container Oral BID BM  . folic acid  1 mg Oral Daily  . heparin  5,000 Units Subcutaneous 3 times per day  . LORazepam  1 mg Oral TID  . magic mouthwash w/lidocaine  5 mL Oral Q4H  . nystatin  5 mL Oral QID  . OLANZapine  10 mg Oral QHS  . thiamine  100 mg Oral Daily   PRN Meds RESOURCE THICKENUP CLEAR, white petrolatum Results for orders placed during the hospital encounter of 08/10/13 (from the past 48 hour(s))  CBG MONITORING, ED     Status: None   Collection Time    08/10/13  1:59 PM      Result Value Ref Range   Glucose-Capillary 90  70 - 99 mg/dL  CBC WITH DIFFERENTIAL     Status: Abnormal   Collection Time    08/10/13  2:20 PM      Result Value Ref Range   WBC 12.2 (*) 4.0 - 10.5 K/uL   RBC 4.90  4.22 - 5.81 MIL/uL   Hemoglobin 15.2  13.0 - 17.0 g/dL   HCT 43.9  39.0 - 52.0 %   MCV 89.6  78.0 - 100.0 fL   MCH 31.0  26.0 - 34.0 pg   MCHC 34.6  30.0 - 36.0 g/dL   RDW 13.5  11.5 - 15.5 %   Platelets 360  150 - 400 K/uL   Neutrophils Relative % 76  43 - 77 %   Neutro Abs 9.2 (*) 1.7 - 7.7 K/uL   Lymphocytes Relative 17  12 - 46 %   Lymphs Abs 2.0  0.7 - 4.0 K/uL   Monocytes Relative 6  3 - 12 %   Monocytes Absolute 0.8  0.1 - 1.0 K/uL   Eosinophils Relative 1  0 - 5 %   Eosinophils Absolute 0.1  0.0 - 0.7 K/uL   Basophils Relative 0  0 - 1 %   Basophils Absolute 0.0  0.0 - 0.1 K/uL  COMPREHENSIVE METABOLIC PANEL     Status: None   Collection Time    08/10/13  2:20 PM      Result Value Ref Range   Sodium 147  137 - 147 mEq/L   Potassium 4.0  3.7 - 5.3 mEq/L   Chloride 103  96 - 112 mEq/L   CO2 29  19 - 32 mEq/L   Glucose, Bld 95  70 - 99 mg/dL   BUN 16  6 - 23 mg/dL   Creatinine, Ser 0.63  0.50 - 1.35 mg/dL   Calcium 9.5  8.4 - 10.5 mg/dL   Total Protein 7.5  6.0 - 8.3 g/dL   Albumin 4.0  3.5 - 5.2 g/dL   AST 15  0 - 37 U/L   ALT 15  0 - 53 U/L   Alkaline Phosphatase 78  39 - 117 U/L    Total Bilirubin 0.7  0.3 - 1.2 mg/dL   GFR calc non Af Amer >90  >90 mL/min   GFR calc Af Amer >90  >90 mL/min   Comment: (NOTE)     The eGFR has been calculated using the CKD EPI equation.     This calculation has not been validated in all clinical situations.     eGFR's persistently <90 mL/min signify possible Chronic Kidney     Disease.  LIPASE, BLOOD     Status: None   Collection Time    08/10/13  2:20 PM      Result Value Ref Range   Lipase 12  11 - 59 U/L  ACETAMINOPHEN LEVEL     Status: None   Collection Time    08/10/13  2:20 PM      Result Value Ref Range   Acetaminophen (Tylenol), Serum <15.0  10 - 30 ug/mL   Comment:            THERAPEUTIC CONCENTRATIONS VARY     SIGNIFICANTLY. A RANGE OF 10-30     ug/mL MAY BE AN EFFECTIVE     CONCENTRATION FOR MANY PATIENTS.     HOWEVER, SOME ARE BEST TREATED     AT CONCENTRATIONS OUTSIDE THIS     RANGE.     ACETAMINOPHEN CONCENTRATIONS     >150 ug/mL AT 4 HOURS AFTER     INGESTION AND >50 ug/mL AT 12     HOURS AFTER INGESTION ARE     OFTEN ASSOCIATED WITH TOXIC     REACTIONS.  ETHANOL     Status: None   Collection Time    08/10/13  2:20 PM      Result Value Ref Range   Alcohol, Ethyl (B) <11  0 - 11 mg/dL   Comment:            LOWEST DETECTABLE LIMIT FOR     SERUM ALCOHOL IS  11 mg/dL     FOR MEDICAL PURPOSES ONLY  CK     Status: None   Collection Time    08/10/13  2:20 PM      Result Value Ref Range   Total CK 117  7 - 232 U/L  HIV ANTIBODY (ROUTINE TESTING)     Status: None   Collection Time    08/10/13  2:20 PM      Result Value Ref Range   HIV NON REACTIVE  NON REACTIVE   Comment: Performed at [facility], ED     Status: None   Collection Time    08/10/13  3:01 PM      Result Value Ref Range   Sodium 145  137 - 147 mEq/L   Potassium 3.8  3.7 - 5.3 mEq/L   Chloride 101  96 - 112 mEq/L   BUN 16  6 - 23 mg/dL   Creatinine, Ser 0.70  0.50 - 1.35 mg/dL   Glucose, Bld 95  70 - 99 mg/dL    Calcium, Ion 1.19  1.12 - 1.23 mmol/L   TCO2 32  0 - 100 mmol/L   Hemoglobin 15.6  13.0 - 17.0 g/dL   HCT 46.0  39.0 - 52.0 %  I-STAT CG4 LACTIC ACID, ED     Status: None   Collection Time    08/10/13  3:02 PM      Result Value Ref Range   Lactic Acid, Venous 1.48  0.5 - 2.2 mmol/L  MRSA PCR SCREENING     Status: None   Collection Time    08/10/13  6:50 PM      Result Value Ref Range   MRSA by PCR NEGATIVE  NEGATIVE   Comment:            The GeneXpert MRSA Assay (FDA     approved for NASAL specimens     only), is one component of a     comprehensive MRSA colonization     surveillance program. It is not     intended to diagnose MRSA     infection nor to guide or     monitor treatment for     MRSA infections.  URINE RAPID DRUG SCREEN (HOSP PERFORMED)     Status: None   Collection Time    08/10/13  8:18 PM      Result Value Ref Range   Opiates NONE DETECTED  NONE DETECTED   Cocaine NONE DETECTED  NONE DETECTED   Benzodiazepines NONE DETECTED  NONE DETECTED   Amphetamines NONE DETECTED  NONE DETECTED   Tetrahydrocannabinol NONE DETECTED  NONE DETECTED   Barbiturates NONE DETECTED  NONE DETECTED   Comment:            DRUG SCREEN FOR MEDICAL PURPOSES     ONLY.  IF CONFIRMATION IS NEEDED     FOR ANY PURPOSE, NOTIFY LAB     WITHIN 5 DAYS.                LOWEST DETECTABLE LIMITS     FOR URINE DRUG SCREEN     Drug Class       Cutoff (ng/mL)     Amphetamine      1000     Barbiturate      200     Benzodiazepine   458     Tricyclics       592     Opiates  300     Cocaine          300     THC              50  LEGIONELLA ANTIGEN, URINE     Status: None   Collection Time    08/10/13  8:18 PM      Result Value Ref Range   Specimen Description URINE, CATHETERIZED     Special Requests NONE     Legionella Antigen, Urine       Value: Negative for Legionella pneumophilia serogroup 1     Performed at [facility]   Report Status 08/11/2013 FINAL      Dg Chest 2  View  08/10/2013   CLINICAL DATA:  Clinical dehydration  EXAM: CHEST  2 VIEW  COMPARISON:  DG CHEST 2V dated 08/01/2013  FINDINGS: The lungs are well-expanded. There is new mildly increased density adjacent to the cardiac apex on the frontal film which projects in the retrocardiac region on the lateral film. There is no pleural effusion or pneumothorax. The cardiac silhouette is normal in size. The pulmonary vascularity is not engorged. The mediastinum is normal in width. The observed portions of the bony thorax appear normal.  IMPRESSION: There is minimal subsegmental atelectasis or early pneumonia in the anterior aspect of the left lower lobe. Elsewhere the lung parenchyma appears clear. There is no evidence of CHF.   Electronically Signed   By: David  Martinique   On: 08/10/2013 16:29   Ct Soft Tissue Neck W Contrast  08/11/2013   CLINICAL DATA:  Severe dysphagia.  Difficulty swallowing.  EXAM: CT NECK WITH CONTRAST  TECHNIQUE: Multidetector CT imaging of the neck was performed using the standard protocol following the bolus administration of intravenous contrast.  CONTRAST:  56m OMNIPAQUE IOHEXOL 300 MG/ML  SOLN  COMPARISON:  None.  FINDINGS: Visualized intracranial contents, orbits, and sinuses are normal.  The tongue is normal. The tonsils are symmetric. Negative for peritonsillar abscess or mass. The epiglottis and larynx are normal.  The thyroid is normal. Parotid and submandibular glands are normal bilaterally.  Negative for adenopathy in the neck.  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 No acute bony abnormality. Dental caries lower molars bilaterally.  IMPRESSION: No acute abnormality.  Negative for mass or adenopathy in the neck.   Electronically Signed   By: CFranchot GalloM.D.   On: 08/11/2013 09:05              Blood pressure 138/86, pulse 94, temperature 98.3 F (36.8 C), temperature source Oral, resp. rate 16, height 6' 2"  (1.88 m), weight 69.355 kg (152 lb 14.4 oz), SpO2 99.00%.  Physical exam:    General-- thin white male who has a dQuarry managerin the room and is handcuffed to the bed  Mouth -- obvious ulcerations on the lips and tongue the patient is able to talk. Heart-- regular rate and rhythm without murmurs are gallops  Lungs--clear Abdomen-- soft and nontender  Assessment: 1. Odynophagia. Patient has severe alterations of the mouth and tongue and I suspect probably the esophagus as well. CMV, herpes all considerations. He is HIV-negative  Plan: we will proceed tomorrow with EGD and likely obtain viral cultures for herpes and CMV 50s lesions persist in his esophagus. His hair limited his oropharynx will likely need ENT to get involved.   Antawn Sison JR,Treyana Sturgell L 08/11/2013, 12:59 PM

## 2013-08-12 NOTE — Interval H&P Note (Signed)
History and Physical Interval Note:  08/12/2013 9:07 AM  Chase CrimesMarshall Bush  has presented today for surgery, with the diagnosis of odynophagia mouth ulcers  The various methods of treatment have been discussed with the patient and family. After consideration of risks, benefits and other options for treatment, the patient has consented to  Procedure(s) with comments: ESOPHAGOGASTRODUODENOSCOPY (EGD) WITH PROPOFOL (N/A) - needs fungal culture brushing as a surgical intervention .  The patient's history has been reviewed, patient examined, no change in status, stable for surgery.  I have reviewed the patient's chart and labs.  Questions were answered to the patient's satisfaction.     Harsh Trulock JR,Natassia Guthridge L

## 2013-08-12 NOTE — Progress Notes (Signed)
PATIENT DETAILS Name: Chase Bush Age: 33 y.o. Sex: male Date of Birth: 1981/03/19 Admit Date: 08/10/2013 Admitting Physician Marinda Elk, MD PCP:No PCP Per Patient  Subjective: Continues to have severe odynophagia and dysphagia.  Assessment/Plan: Principal Problem:   Aspiration pneumonia - Patient was admitted, continue with Unasyn and Zithromax- day 3 - Afebrile, do have mild leukocytosis a few days ago, will repeat CBC in a.m.  Active Problems: Odynophagia. - EGD done on 2/25-showed that information was only limited to the mouth, esophagus and stomach was normal.? Is this all thrush. Await biopsy results done with EGD - Will start on Diflucan on 2/25, obtain orthopantogram, is significant abnormality seen will consult done to surgery. - We'll check HIV, and HIV viral load. - Cautiously continue to dysphagia 2 diet with nectar thick liquids.  Narcotic and Benzo abuser ? Alcohol use  - no signs of DTs, on Ativan dose reduced, and folic acid thiamine. Counseled to quit alcohol. Dr Thedore Mins d[facility]w [facility]. Low dose Ativan scheduled as per regimen from prison (chronic)  History of substance induced mood disorder/substance abuse -appreciate psych consult - Continue olanzapine, Haldol remains on hold.  Dehydration. Resolved with Gentle IV fluids.  Mod PCM - protein supplementation.  Reported muscle rigidity in the ER. - Improved after Haldol dose reduced . For now I have stopped Cogentin and Haldol completely. Low-dose Zyprexa to continue.  Disposition: Remain inpatient  DVT Prophylaxis: Prophylactic Heparin   Code Status: Full code  Family Communication None at bedside  Procedures:  EGD on 2/25  CONSULTS:  GI and psychiatry  Time spent 40 minutes-which includes 50% of the time with face-to-face with patient/ family and coordinating care related to the above assessment and plan.    MEDICATIONS: Scheduled Meds: . [MAR HOLD]  ampicillin-sulbactam (UNASYN) IV  1.5 g Intravenous Q6H  . [facility] HOLD] azithromycin  500 mg Intravenous Q24H  . [facility] HOLD] chlorhexidine  15 mL Mouth Rinse BID  . [MAR HOLD] feeding supplement (ENSURE)  1 Container Oral BID BM  . [facility] HOLD] folic acid  1 mg Oral Daily  . [MAR HOLD] heparin  5,000 Units Subcutaneous 3 times per day  . [MAR HOLD] LORazepam  1 mg Oral TID  . [MAR HOLD] magic mouthwash w/lidocaine  5 mL Oral Q4H  . [MAR HOLD] nystatin  5 mL Oral QID  . [MAR HOLD] OLANZapine  10 mg Oral QHS  . [facility] HOLD] thiamine  100 mg Oral Daily   Continuous Infusions: . sodium chloride    . lactated ringers 20 mL/hr at 08/12/13 0826   PRN Meds:.[MAR HOLD] RESOURCE THICKENUP CLEAR, [MAR HOLD] white petrolatum  Antibiotics: Anti-infectives   Start     Dose/Rate Route Frequency Ordered Stop   08/10/13 1930  [MAR Hold]  ampicillin-sulbactam (UNASYN) 1.5 g in sodium chloride 0.9 % 50 mL IVPB     (On MAR Hold since 08/12/13 0835)   1.5 g 100 mL/hr over 30 Minutes Intravenous Every 6 hours 08/10/13 1832     08/10/13 1930  [MAR Hold]  azithromycin (ZITHROMAX) 500 mg in dextrose 5 % 250 mL IVPB     (On MAR Hold since 08/12/13 0835)   500 mg 250 mL/hr over 60 Minutes Intravenous Every 24 hours 08/10/13 1832         PHYSICAL EXAM: Vital signs in last 24 hours: Filed Vitals:   08/12/13 1000 08/12/13 1005 08/12/13 1010 08/12/13 1015  BP: 180/103 167/90 150/75  149/88  Pulse: 104 100 66 89  Temp:      TempSrc:      Resp: 18 16 15 14   Height:      Weight:      SpO2: 96% 96% 97% 98%    Weight change:  Filed Weights   08/11/13 0900  Weight: 69.355 kg (152 lb 14.4 oz)   Body mass index is 19.62 kg/(m^2).   Gen Exam: Awake but slightly lethargic this morning. Still has a lot of trismus. Neck: Supple, No JVD.   Chest: B/L Clear.   CVS: S1 S2 Regular, no murmurs.  Abdomen: soft, BS +, non tender, non distended.  Extremities: no edema, lower extremities warm to touch. Neurologic: Non  Focal.   Skin: No Rash.   Wounds: N/A.   Intake/Output from previous day:  Intake/Output Summary (Last 24 hours) at 08/12/13 1031 Last data filed at 08/12/13 1027  Gross per 24 hour  Intake    600 ml  Output   2900 ml  Net  -2300 ml     LAB RESULTS: CBC  Recent Labs Lab 08/10/13 1420 08/10/13 1501  WBC 12.2*  --   HGB 15.2 15.6  HCT 43.9 46.0  PLT 360  --   MCV 89.6  --   MCH 31.0  --   MCHC 34.6  --   RDW 13.5  --   LYMPHSABS 2.0  --   MONOABS 0.8  --   EOSABS 0.1  --   BASOSABS 0.0  --     Chemistries   Recent Labs Lab 08/10/13 1420 08/10/13 1501 08/12/13 0512  NA 147 145  --   K 4.0 3.8 3.4*  CL 103 101  --   CO2 29  --   --   GLUCOSE 95 95  --   BUN 16 16  --   CREATININE 0.63 0.70  --   CALCIUM 9.5  --   --   MG  --   --  1.9    CBG:  Recent Labs Lab 08/10/13 1359  GLUCAP 90    GFR Estimated Creatinine Clearance: 128.9 ml/min (by C-G formula based on Cr of 0.7).  Coagulation profile No results found for this basename: INR, PROTIME,  in the last 168 hours  Cardiac Enzymes No results found for this basename: CK, CKMB, TROPONINI, MYOGLOBIN,  in the last 168 hours  No components found with this basename: POCBNP,  No results found for this basename: DDIMER,  in the last 72 hours No results found for this basename: HGBA1C,  in the last 72 hours No results found for this basename: CHOL, HDL, LDLCALC, TRIG, CHOLHDL, LDLDIRECT,  in the last 72 hours No results found for this basename: TSH, T4TOTAL, FREET3, T3FREE, THYROIDAB,  in the last 72 hours No results found for this basename: VITAMINB12, FOLATE, FERRITIN, TIBC, IRON, RETICCTPCT,  in the last 72 hours  Recent Labs  08/10/13 1420  LIPASE 12    Urine Studies No results found for this basename: UACOL, UAPR, USPG, UPH, UTP, UGL, UKET, UBIL, UHGB, UNIT, UROB, ULEU, UEPI, UWBC, URBC, UBAC, CAST, CRYS, UCOM, BILUA,  in the last 72 hours  MICROBIOLOGY: Recent Results (from the past 240  hour(s))  MRSA PCR SCREENING     Status: None   Collection Time    08/10/13  6:50 PM      Result Value Ref Range Status   MRSA by PCR NEGATIVE  NEGATIVE Final   Comment:  The GeneXpert MRSA Assay (FDA     approved for NASAL specimens     only), is one component of a     comprehensive MRSA colonization     surveillance program. It is not     intended to diagnose MRSA     infection nor to guide or     monitor treatment for     MRSA infections.    RADIOLOGY STUDIES/RESULTS: Dg Chest 2 View  08/10/2013   CLINICAL DATA:  Clinical dehydration  EXAM: CHEST  2 VIEW  COMPARISON:  DG CHEST 2V dated 08/01/2013  FINDINGS: The lungs are well-expanded. There is new mildly increased density adjacent to the cardiac apex on the frontal film which projects in the retrocardiac region on the lateral film. There is no pleural effusion or pneumothorax. The cardiac silhouette is normal in size. The pulmonary vascularity is not engorged. The mediastinum is normal in width. The observed portions of the bony thorax appear normal.  IMPRESSION: There is minimal subsegmental atelectasis or early pneumonia in the anterior aspect of the left lower lobe. Elsewhere the lung parenchyma appears clear. There is no evidence of CHF.   Electronically Signed   By: David  Swazil[place]   On: 08/10/2013 16:29   Ct Soft Tissue Neck W Contrast  08/11/2013   CLINICAL DATA:  Severe dysphagia.  Difficulty swallowing.  EXAM: CT NECK WITH CONTRAST  TECHNIQUE: Multidetector CT imaging of the neck was performed using the standard protocol following the bolus administration of intravenous contrast.  CONTRAST:  75mL OMNIPAQUE IOHEXOL 300 MG/ML  SOLN  COMPARISON:  None.  FINDINGS: Visualized intracranial contents, orbits, and sinuses are normal.  The tongue is normal. The tonsils are symmetric. Negative for peritonsillar abscess or mass. The epiglottis and larynx are normal.  The thyroid is normal. Parotid and submandibular glands are normal  bilaterally.  Negative for adenopathy in the neck.  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 No acute bony abnormality. Dental caries lower molars bilaterally.  IMPRESSION: No acute abnormality.  Negative for mass or adenopathy in the neck.   Electronically Signed   By: Marlan Palauharles  Clark M.D.   On: 08/11/2013 09:05    Jeoffrey MassedGHIMIRE,Fredric Slabach, MD  [facility] Pager:[phone]  If 7PM-7AM, please contact night-coverage [email or web address] Password [email or web address] 08/12/2013, 10:31 AM   LOS: 2 days

## 2013-08-12 NOTE — Op Note (Signed)
[facility] [address] [place], [id number]   ENDOSCOPY PROCEDURE REPORT  PATIENT: Chase Bush, Chase  MR#: [id number] BIRTHDATE: 08/18/1980 , 32  yrs. old GENDER: Male ENDOSCOPIST:Aikeem Lilley Randa EvensEdwards, MD REFERRED BY:  [facility] PROCEDURE DATE:  08/12/2013 PROCEDURE:    EGD with biopsy and brushing ASA CLASS:    class 2 INDICATIONS:   young man incarcerated who has had severe odynophagia and severe pain in his mouth. He appears to have oral ulcerations and this is done to evaluate for viral or fungal infection MEDICATION: general anesthesia. TOPICAL ANESTHETIC:  NONE  DESCRIPTION OF PROCEDURE:   The procedure had been explained to the patient and  CONSENT WAS OBTAINED> THE PATIENT WAS INTUBATED BY ANESTHESIA. We then pass the scope into the esophagus. A complete endoscopy was performed. The duodenum including the 2nd portion of bulb was normal. The stomach was examined in the forward and retroflex view and was normal. The scope is withdrawn back into the esophagus and the esophagus was nearly normal appearance. There was slight redness and irritation in the esophagus. Biopsy was obtained and sent for viral culture. Biopsies were obtained and sent for routine pathology. The scope is withdrawn back into the pharynx. There was a large amount of what appeared to be exudate in the mouth. Brushing was obtained for fungus. The scope was withdrawn in the patient tolerated procedure well. There were no immediate complications.     COMPLICATIONS: None  ENDOSCOPIC IMPRESSION: 1. Inflammation limited to the mouth. Esophagus fairly normal. Question fungal, viral, GC etc.  RECOMMENDATIONS: 1. would start him on Diflucan and questionable treatment for GC. Checked the results of the biopsies on smear. May need  dental evaluation.    _______________________________ Rosalie DoctoreSignedCarman Ching:  Laquan Beier, MD 08/12/2013 9:47 AM    PATIENT NAME:  Chase Bush,  Chase MR#: [id number]

## 2013-08-12 NOTE — Progress Notes (Signed)
ANTIBIOTIC CONSULT NOTE - INITIAL  Pharmacy Consult for Acyclovir  Indication: herpes mucositis  No Known Allergies  Patient Measurements: Height: 6\' 2"  (188 cm) Weight: 152 lb 14.4 oz (69.355 kg) (bed scale) IBW/kg (Calculated) : 82.2  Vital Signs: Temp: 98.6 F (37 C) (02/25 1458) Temp src: Oral (02/25 1458) BP: 134/81 mmHg (02/25 1458) Pulse Rate: 81 (02/25 1458) Intake/Output from previous day: 02/24 0701 - 02/25 0700 In: -  Out: 2900 [Urine:2900] Intake/Output from this shift: Total I/O In: 1620 [P.O.:20; I.V.:600; IV Piggyback:1000] Out: 700 [Urine:700]  Labs:  Recent Labs  08/10/13 1420 08/10/13 1501  WBC 12.2*  --   HGB 15.2 15.6  PLT 360  --   CREATININE 0.63 0.70   Estimated Creatinine Clearance: 128.9 ml/min (by C-G formula based on Cr of 0.7). No results found for this basename: VANCOTROUGH, Leodis BinetVANCOPEAK, VANCORANDOM, GENTTROUGH, GENTPEAK, GENTRANDOM, TOBRATROUGH, TOBRAPEAK, TOBRARND, AMIKACINPEAK, AMIKACINTROU, AMIKACIN,  in the last 72 hours   Microbiology: Recent Results (from the past 720 hour(s))  MRSA PCR SCREENING     Status: None   Collection Time    08/10/13  6:50 PM      Result Value Ref Range Status   MRSA by PCR NEGATIVE  NEGATIVE Final   Comment:            The GeneXpert MRSA Assay (FDA     approved for NASAL specimens     only), is one component of a     comprehensive MRSA colonization     surveillance program. It is not     intended to diagnose MRSA     infection nor to guide or     monitor treatment for     MRSA infections.    Medical History: Past Medical History  Diagnosis Date  . Substance abuse     was using suboxone until Jan 3rd. also hx of benzo abuse.   . Pneumonia     "when I was little; got it again now" (08/10/2013)    Medications:  Prescriptions prior to admission  Medication Sig Dispense Refill  . benztropine (COGENTIN) 1 MG tablet Take 1 mg by mouth 3 (three) times daily.      . haloperidol (HALDOL) 10 MG  tablet Take 10 mg by mouth 3 (three) times daily.      Marland Kitchen. LORazepam (ATIVAN) 2 MG tablet Take 2 mg by mouth 3 (three) times daily.      Marland Kitchen. OLANZapine (ZYPREXA) 10 MG tablet Take 10 mg by mouth at bedtime.       Assessment: 33 y.o. male presented with asp pna and odynophagia. Noted EGD 2/25 shows inflammation only in mouth, none seen in esophagus and stomach. Pt currently on diflucan for thrush, Unasyn and Azithromycin for asp PNA. 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 Noted pt received on oral dose of acyclovir earlier today but MD wants IV with odynophagia. HSV culture pending. HIV negative. Pt with normal renal function  Goal of Therapy:  Treatment of infection  Plan:  1. Acyclovir 345 mg (5mg /kg) IV q8h 2. Will f/u HSV culture, renal function,  and pt's clinical condition  Christoper Fabianaron Nosson Wender, PharmD, BCPS Clinical pharmacist, pager [phone] 08/12/2013,6:17 PM

## 2013-08-12 NOTE — Anesthesia Procedure Notes (Signed)
Procedure Name: Intubation Date/Time: 08/12/2013 9:15 AM Performed by: Arlice ColtMANESS, Joany Khatib B Pre-anesthesia Checklist: Patient identified, Emergency Drugs available, Suction available, Patient being monitored and Timeout performed Patient Re-evaluated:Patient Re-evaluated prior to inductionOxygen Delivery Method: Circle system utilized Preoxygenation: Pre-oxygenation with 100% oxygen Intubation Type: IV induction and Rapid sequence Grade View: Grade I Tube size: 7.5 mm Number of attempts: 1 Airway Equipment and Method: Stylet and Video-laryngoscopy (elective glidescope intubation) Placement Confirmation: ETT inserted through vocal cords under direct vision,  positive ETCO2 and breath sounds checked- equal and bilateral Secured at: 22 cm Tube secured with: Tape Dental Injury: Teeth and Oropharynx as per pre-operative assessment

## 2013-08-12 NOTE — Anesthesia Preprocedure Evaluation (Signed)
Anesthesia Evaluation  Patient identified by MRN, date of birth, ID band Patient awake    Reviewed: Allergy & Precautions, H&P , NPO status , Patient's Chart, lab work & pertinent test results, reviewed documented beta blocker date and time   Airway Mallampati: II TM Distance: >3 FB Neck ROM: full    Dental   Pulmonary pneumonia -, unresolved, Current Smoker,  breath sounds clear to auscultation        Cardiovascular negative cardio ROS  Rhythm:regular     Neuro/Psych  Neuromuscular disease    GI/Hepatic negative GI ROS, (+)     substance abuse   ,   Endo/Other  negative endocrine ROS  Renal/GU negative Renal ROS  negative genitourinary   Musculoskeletal   Abdominal   Peds  Hematology negative hematology ROS (+)   Anesthesia Other Findings See surgeon's H&P   Reproductive/Obstetrics negative OB ROS                           Anesthesia Physical Anesthesia Plan  ASA: III  Anesthesia Plan: General   Post-op Pain Management:    Induction: Intravenous  Airway Management Planned: Oral ETT  Additional Equipment:   Intra-op Plan:   Post-operative Plan: Extubation in OR  Informed Consent: I have reviewed the patients History and Physical, chart, labs and discussed the procedure including the risks, benefits and alternatives for the proposed anesthesia with the patient or authorized representative who has indicated his/her understanding and acceptance.   Dental Advisory Given  Plan Discussed with: CRNA and Surgeon  Anesthesia Plan Comments:         Anesthesia Quick Evaluation

## 2013-08-12 NOTE — Clinical Social Work Psych Note (Signed)
Psychiatry evaluated pt on 08/11/2013 (MD Arfeen) and recommended medication adjustment.  Psych CSW remains available for assistance.  Unit CSW made aware.    Chase Bush, LCSWA [phone]  Clinical Social Work

## 2013-08-12 NOTE — Anesthesia Postprocedure Evaluation (Signed)
Anesthesia Post Note  Patient: Chase Bush  Procedure(s) Performed: Procedure(s) (LRB): ESOPHAGOGASTRODUODENOSCOPY (EGD) WITH PROPOFOL (N/A)  Anesthesia type: General  Patient location: PACU  Post pain: Pain level controlled  Post assessment: Patient's Cardiovascular Status Stable  Last Vitals:  Filed Vitals:   08/12/13 1015  BP:   Pulse: 89  Temp:   Resp: 14    Post vital signs: Reviewed and stable  Level of consciousness: alert  Complications: No apparent anesthesia complications

## 2013-08-12 NOTE — Transfer of Care (Signed)
Immediate Anesthesia Transfer of Care Note  Patient: Chase CrimesMarshall Bush  Procedure(s) Performed: Procedure(s) with comments: ESOPHAGOGASTRODUODENOSCOPY (EGD) WITH PROPOFOL (N/A) - needs fungal culture brushing  Patient Location: Endoscopy Unit  Anesthesia Type:General  Level of Consciousness: awake and alert   Airway & Oxygen Therapy: Patient Spontanous Breathing and Patient connected to nasal cannula oxygen  Post-op Assessment: Report given to PACU RN and Post -op Vital signs reviewed and stable  Post vital signs: Reviewed and stable  Complications: No apparent anesthesia complications

## 2013-08-12 NOTE — Progress Notes (Signed)
Speech Language Pathology Treatment: Dysphagia  Patient Details Name: Chase Bush MRN: [id number] DOB: 02/19/1981 Today's Date: 08/12/2013 Time: [id number] SLP Time Calculation (min): 55 min  Assessment / Plan / Recommendation Clinical Impression  DIscussed pt status with RN.  SLP and RN in to see pt.  Pt was noted to have significant right oral leakage of secretions.  Pt reclined slightly and encouraged to relax. Pt exhibited highly variable/inconsistent lip closure and tongue ROM during speech, po trials, and oral motor exercises.  RN present while SLP provided miniscule amounts of meds via syringe (magic mouthwash and nystatin) into pt's mouth.  Pt was encouraged to maintain reclined position, move tongue around in mouth, and swallow when needed.  Frequently pt indicated he felt like he was choking, but demonstrated clear voicing, and exhibited intermittent swallow.  Minimal amount of medication was lost to the towel.  Pt was repeatedly encouraged to keep medication in his mouth, swallowing intermittently, to allow maximum benefit of the meds.  No overt coughing/choking was observed throughout the entire (and lengthy) process.   MBS may be beneficial for visualization of oral and pharyngeal swallow function, and best course of treatment.  ST to continue to follow.     HPI HPI: Chase Bush is a 33 y.o. male with past medical history of psychiatric disorders also benzo withdrawals, [facility] he was spitting up his meds as he couldn't swallow. He's also been complaining of drooling, considerable speech difficulties , tremor in his upper extremities and lacks the ability of performing ADLs. The patient relates some cough with drinking fluids and relates that he cannot swallow. Pt is supected to have aspiration pna.    Pertinent Vitals VSS  SLP Plan  Continue with current plan of care    Recommendations Diet recommendations: Dysphagia 1 (puree);Thin  liquid Liquids provided via: Teaspoon Medication Administration: Crushed with puree Supervision: Staff to assist with self feeding;Full supervision/cueing for compensatory strategies Compensations: Slow rate;Small sips/bites Postural Changes and/or Swallow Maneuvers: Seated upright 90 degrees;Upright 30-60 min after meal;Out of bed for meals              Oral Care Recommendations: Oral care Q4 per protocol Follow up Recommendations: Home health SLP Plan: Continue with current plan of care    GO    Clarrissa Shimkus B. Murvin NatalBueche, [facility] LLCMSP, CCC-SLP [phone]  Leigh AuroraBueche, Kervin Bones Brown 08/12/2013, 3:59 PM

## 2013-08-13 LAB — PHOSPHORUS: Phosphorus: 3.5 mg/dL (ref 2.3–4.6)

## 2013-08-13 LAB — BASIC METABOLIC PANEL
BUN: 9 mg/dL (ref 6–23)
CO2: 26 meq/L (ref 19–32)
Calcium: 9.5 mg/dL (ref 8.4–10.5)
Chloride: 100 mEq/L (ref 96–112)
Creatinine, Ser: 0.51 mg/dL (ref 0.50–1.35)
GFR calc Af Amer: >90 mL/min (ref >90)
GFR calc non Af Amer: >90 mL/min (ref >90)
GLUCOSE: 98 mg/dL (ref 70–99)
Potassium: 3.8 mEq/L (ref 3.7–5.3)
SODIUM: 142 meq/L (ref 137–147)

## 2013-08-13 LAB — CBC
HCT: 38.6 % — ABNORMAL LOW (ref 39.0–52.0)
Hemoglobin: 13.5 g/dL (ref 13.0–17.0)
MCH: 30.8 pg (ref 26.0–34.0)
MCHC: 35 g/dL (ref 30.0–36.0)
MCV: 88.1 fL (ref 78.0–100.0)
Platelets: 334 10*3/uL (ref 150–400)
RBC: 4.38 MIL/uL (ref 4.22–5.81)
RDW: 13.3 % (ref 11.5–15.5)
WBC: 8 10*3/uL (ref 4.0–10.5)

## 2013-08-13 LAB — MAGNESIUM: MAGNESIUM: 2 mg/dL (ref 1.5–2.5)

## 2013-08-13 NOTE — Progress Notes (Signed)
Pt has and elevated diastolic BP of 100. Overall BP manually is 150/100. MD notified and made aware.

## 2013-08-13 NOTE — Progress Notes (Signed)
PATIENT DETAILS Name: Chase Bush Age: 33 y.o. Sex: male Date of Birth: [birth year] Admit Date: 08/10/2013 Admitting Physician Marinda Elk, MD PCP:No PCP Per Patient  Subjective: Able to open mouth much more today- claims to be slightly better as well.  Assessment/Plan: Principal Problem:   Aspiration pneumonia - Patient was admitted, continue with Unasyn and Zithromax- day 4 - Afebrile,  mild leukocytosis has resolved, clinically improved.  Active Problems: Odynophagia. - EGD done on 2/25-showed that information was only limited to the mouth, esophagus and stomach was normal.?Thrush vs Herpes Await biopsy results done with EGD - Started on Diflucan and Acyclovir on 2/25, obtain orthopantogram, is significant abnormality seen will consult done to surgery. - We'll check HIV, and HIV viral load. - Cautiously continue with a full liquid diet  Narcotic and Benzo abuser ? Alcohol use  - no signs of DTs, on Ativan dose reduced, and folic acid thiamine. Counseled to quit alcohol. Dr Thedore Mins d[facility]w [facility]. Low dose Ativan scheduled as per regimen from prison (chronic)  History of substance induced mood disorder/substance abuse -appreciate psych consult - Continue olanzapine, Haldol remains on hold.  Dehydration. Resolved with Gentle IV fluids.  Mod PCM - protein supplementation.  Reported muscle rigidity in the ER. - Improved after Haldol dose reduced . For now I have stopped Cogentin and Haldol completely. Low-dose Zyprexa to continue.  Disposition: Remain inpatient  DVT Prophylaxis: Prophylactic Heparin   Code Status: Full code  Family Communication None at bedside  Procedures:  EGD on 2/25  CONSULTS:  GI and psychiatry  MEDICATIONS: Scheduled Meds: . acyclovir  5 mg/kg Intravenous 3 times per day  . ampicillin-sulbactam (UNASYN) IV  1.5 g Intravenous Q6H  . azithromycin  500 mg Intravenous Q24H  . chlorhexidine  15 mL Mouth Rinse BID  .  feeding supplement (ENSURE)  1 Container Oral BID BM  . fluconazole (DIFLUCAN) IV  100 mg Intravenous Q24H  . folic acid  1 mg Oral Daily  . heparin  5,000 Units Subcutaneous 3 times per day  . LORazepam  1 mg Oral TID  . magic mouthwash w/lidocaine  5 mL Oral Q4H  . nystatin  5 mL Oral QID  . OLANZapine  10 mg Oral QHS  . thiamine  100 mg Oral Daily   Continuous Infusions:   PRN Meds:.RESOURCE THICKENUP CLEAR, white petrolatum  Antibiotics: Anti-infectives   Start     Dose/Rate Route Frequency Ordered Stop   08/13/13 1500  fluconazole (DIFLUCAN) IVPB 100 mg     100 mg 50 mL/hr over 60 Minutes Intravenous Every 24 hours 08/12/13 1457     08/12/13 1900  acyclovir (ZOVIRAX) 345 mg in dextrose 5 % 100 mL IVPB     5 mg/kg  69.4 kg 106.9 mL/hr over 60 Minutes Intravenous 3 times per day 08/12/13 1827     08/12/13 1530  fluconazole (DIFLUCAN) IVPB 200 mg     200 mg 100 mL/hr over 60 Minutes Intravenous  Once 08/12/13 1457 08/12/13 1730   08/12/13 1515  acyclovir (ZOVIRAX) 200 MG capsule 200 mg  Status:  Discontinued     200 mg Oral 5 times daily 08/12/13 1514 08/12/13 1817   08/10/13 1930  ampicillin-sulbactam (UNASYN) 1.5 g in sodium chloride 0.9 % 50 mL IVPB     1.5 g 100 mL/hr over 30 Minutes Intravenous Every 6 hours 08/10/13 1832     08/10/13 1930  azithromycin (ZITHROMAX) 500 mg in dextrose  5 % 250 mL IVPB     500 mg 250 mL/hr over 60 Minutes Intravenous Every 24 hours 08/10/13 1832         PHYSICAL EXAM: Vital signs in last 24 hours: Filed Vitals:   08/12/13 1015 08/12/13 1458 08/12/13 2230 08/13/13 0547  BP: 149/88 134/81 148/97 146/88  Pulse: 89 81 76 55  Temp:  98.6 F (37 C) 98.1 F (36.7 C) 98.2 F (36.8 C)  TempSrc:  Oral Oral Oral  Resp: 14 16 16 16   Height:      Weight:      SpO2: 98% 97% 97% 98%    Weight change:  Filed Weights   08/11/13 0900  Weight: 69.355 kg (152 lb 14.4 oz)   Body mass index is 19.62 kg/(m^2).   Gen Exam: Awake,opening  mouth much more. Neck: Supple, No JVD.   Chest: B/L Clear.  No rales CVS: S1 S2 Regular, no murmurs.  Abdomen: soft, BS +, non tender, non distended.  Extremities: no edema, lower extremities warm to touch. Neurologic: Non Focal.   Skin: No Rash.   Wounds: N/A.   Intake/Output from previous day:  Intake/Output Summary (Last 24 hours) at 08/13/13 1102 Last data filed at 08/13/13 0230  Gross per 24 hour  Intake   1320 ml  Output   2400 ml  Net  -1080 ml     LAB RESULTS: CBC  Recent Labs Lab 08/10/13 1420 08/10/13 1501 08/13/13 0430  WBC 12.2*  --  8.0  HGB 15.2 15.6 13.5  HCT 43.9 46.0 38.6*  PLT 360  --  334  MCV 89.6  --  88.1  MCH 31.0  --  30.8  MCHC 34.6  --  35.0  RDW 13.5  --  13.3  LYMPHSABS 2.0  --   --   MONOABS 0.8  --   --   EOSABS 0.1  --   --   BASOSABS 0.0  --   --     Chemistries   Recent Labs Lab 08/10/13 1420 08/10/13 1501 08/12/13 0512 08/13/13 0430  NA 147 145  --  142  K 4.0 3.8 3.4* 3.8  CL 103 101  --  100  CO2 29  --   --  26  GLUCOSE 95 95  --  98  BUN 16 16  --  9  CREATININE 0.63 0.70  --  0.51  CALCIUM 9.5  --   --  9.5  MG  --   --  1.9 2.0    CBG:  Recent Labs Lab 08/10/13 1359  GLUCAP 90    GFR Estimated Creatinine Clearance: 128.9 ml/min (by C-G formula based on Cr of 0.51).  Coagulation profile No results found for this basename: INR, PROTIME,  in the last 168 hours  Cardiac Enzymes No results found for this basename: CK, CKMB, TROPONINI, MYOGLOBIN,  in the last 168 hours  No components found with this basename: POCBNP,  No results found for this basename: DDIMER,  in the last 72 hours No results found for this basename: HGBA1C,  in the last 72 hours No results found for this basename: CHOL, HDL, LDLCALC, TRIG, CHOLHDL, LDLDIRECT,  in the last 72 hours No results found for this basename: TSH, T4TOTAL, FREET3, T3FREE, THYROIDAB,  in the last 72 hours No results found for this basename: VITAMINB12,  FOLATE, FERRITIN, TIBC, IRON, RETICCTPCT,  in the last 72 hours  Recent Labs  08/10/13 1420  LIPASE 12  Urine Studies No results found for this basename: UACOL, UAPR, USPG, UPH, UTP, UGL, UKET, UBIL, UHGB, UNIT, UROB, ULEU, UEPI, UWBC, URBC, UBAC, CAST, CRYS, UCOM, BILUA,  in the last 72 hours  MICROBIOLOGY: Recent Results (from the past 240 hour(s))  MRSA PCR SCREENING     Status: None   Collection Time    08/10/13  6:50 PM      Result Value Ref Range Status   MRSA by PCR NEGATIVE  NEGATIVE Final   Comment:            The GeneXpert MRSA Assay (FDA     approved for NASAL specimens     only), is one component of a     comprehensive MRSA colonization     surveillance program. It is not     intended to diagnose MRSA     infection nor to guide or     monitor treatment for     MRSA infections.    RADIOLOGY STUDIES/RESULTS: Dg Chest 2 View  08/10/2013   CLINICAL DATA:  Clinical dehydration  EXAM: CHEST  2 VIEW  COMPARISON:  DG CHEST 2V dated 08/01/2013  FINDINGS: The lungs are well-expanded. There is new mildly increased density adjacent to the cardiac apex on the frontal film which projects in the retrocardiac region on the lateral film. There is no pleural effusion or pneumothorax. The cardiac silhouette is normal in size. The pulmonary vascularity is not engorged. The mediastinum is normal in width. The observed portions of the bony thorax appear normal.  IMPRESSION: There is minimal subsegmental atelectasis or early pneumonia in the anterior aspect of the left lower lobe. Elsewhere the lung parenchyma appears clear. There is no evidence of CHF.   Electronically Signed   By: David  Swazil[place]Jordan   On: 08/10/2013 16:29   Ct Soft Tissue Neck W Contrast  08/11/2013   CLINICAL DATA:  Severe dysphagia.  Difficulty swallowing.  EXAM: CT NECK WITH CONTRAST  TECHNIQUE: Multidetector CT imaging of the neck was performed using the standard protocol following the bolus administration of intravenous  contrast.  CONTRAST:  75mL OMNIPAQUE IOHEXOL 300 MG/ML  SOLN  COMPARISON:  None.  FINDINGS: Visualized intracranial contents, orbits, and sinuses are normal.  The tongue is normal. The tonsils are symmetric. Negative for peritonsillar abscess or mass. The epiglottis and larynx are normal.  The thyroid is normal. Parotid and submandibular glands are normal bilaterally.  Negative for adenopathy in the neck.  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 No acute bony abnormality. Dental caries lower molars bilaterally.  IMPRESSION: No acute abnormality.  Negative for mass or adenopathy in the neck.   Electronically Signed   By: Marlan Palauharles  Clark M.D.   On: 08/11/2013 09:05    Jeoffrey MassedGHIMIRE,SHANKER, MD  [facility] Pager:[phone]  If 7PM-7AM, please contact night-coverage [email or web address] Password [email or web address] 08/13/2013, 11:02 AM   LOS: 3 days

## 2013-08-13 NOTE — Progress Notes (Signed)
Speech Language Pathology Treatment: Dysphagia  Patient Details Name: Chase Bush MRN: [id number] DOB: 08/07/1980 Today's Date: 08/13/2013 Time: [id number] SLP Time Calculation (min): 24 min  Assessment / Plan / Recommendation Clinical Impression  Today pt presents with improved oral health and takes thin liquids with a straw without significant difficulty. When someone uses a straw there has to be base of tonguecontact with palate for suction and bolus transit, which he does successfully. When given purees or solid however, pt does not recruit any lingual movement for bolus cohesion or transit, instead sucking to transit bolus despite max cues to simply close lips (which he otherwise can do). In speech  pt demonstrates excellent strength for labial and lingual phonemes /m/ /b/ /d/ and /g/. In connected speech he makes poor effort. Though certainly there is an oral health component, pt denies any pain with swallow.  He insists that the reason he can't use his tongue is the same reason he cant use his hands bilaterally, though again function is inconsistent.  Unless there is a medication component, continue to feel that pts dysphagia is primarily behavioral. Recommend offering upgraded solid textures hoping that pt will eventually attempt and manipulate solid foods.    HPI HPI: Chase CrimesMarshall Bush is a 33 y.o. male with past medical history of psychiatric disorders also benzo withdrawals, [facility] as he was spitting up his meds as he couldn't swallow. He's also been complaining of drooling, considerable speech difficulties , tremor in his upper extremities and lacks the ability of performing ADLs. The patient relates some cough with drinking fluids and relates that he cannot swallow. Pt is supected to have aspiration pna.    Pertinent Vitals NA  SLP Plan  Continue with current plan of care    Recommendations Diet recommendations: Dysphagia 1 (puree);Thin  liquid Liquids provided via: Straw Medication Administration: Crushed with puree Supervision: Staff to assist with self feeding;Full supervision/cueing for compensatory strategies Compensations: Slow rate;Small sips/bites Postural Changes and/or Swallow Maneuvers: Seated upright 90 degrees;Upright 30-60 min after meal;Out of bed for meals              Oral Care Recommendations: Oral care Q4 per protocol Follow up Recommendations: Home health SLP Plan: Continue with current plan of care    GO    [facility] SurgeryBonnie Mykah Shin, MA CCC-SLP [phone]  Claudine MoutonDeBlois, Alek Poncedeleon Caroline 08/13/2013, 4:05 PM

## 2013-08-14 ENCOUNTER — Encounter (HOSPITAL_COMMUNITY): Payer: Self-pay | Admitting: Gastroenterology

## 2013-08-14 LAB — POTASSIUM: Potassium: 3.6 mEq/L — ABNORMAL LOW (ref 3.7–5.3)

## 2013-08-14 LAB — MAGNESIUM: MAGNESIUM: 2.2 mg/dL (ref 1.5–2.5)

## 2013-08-14 LAB — HIV-1 RNA QUANT-NO REFLEX-BLD
HIV 1 RNA Quant: <20 copies/mL (ref <20)
HIV-1 RNA Quant, Log: <1.3 {Log} (ref <1.30)

## 2013-08-14 LAB — PHOSPHORUS: Phosphorus: 3.8 mg/dL (ref 2.3–4.6)

## 2013-08-14 MED ORDER — ENSURE PUDDING PO PUDG: 1.0000 | Freq: Two times a day (BID) | ORAL | Status: DC | PRN

## 2013-08-14 MED ORDER — ENSURE COMPLETE PO LIQD
237.0000 mL | Freq: Two times a day (BID) | ORAL | Status: DC
  Administered 2013-08-14 – 2013-08-16 (×4): 237 mL via ORAL
  Filled 2013-08-14: qty 237

## 2013-08-14 MED ORDER — AMLODIPINE BESYLATE 2.5 MG PO TABS
2.5000 mg | ORAL_TABLET | Freq: Every day | ORAL | Status: DC
  Administered 2013-08-14 – 2013-08-16 (×3): 2.5 mg via ORAL
  Filled 2013-08-14 (×3): qty 1

## 2013-08-14 MED ORDER — LORAZEPAM 1 MG PO TABS: 1.0000 mg | ORAL_TABLET | Freq: Three times a day (TID) | ORAL | Status: DC | PRN

## 2013-08-14 MED ORDER — ACETAMINOPHEN 160 MG/5ML PO SOLN: 650.0000 mg | Freq: Four times a day (QID) | ORAL | Status: DC | PRN

## 2013-08-14 NOTE — Progress Notes (Signed)
Pt able to get up from bed to chair and up from bed to window twice today. Pt was able to tolerate it will. Will continue to monitor pt.

## 2013-08-14 NOTE — Progress Notes (Signed)
[facility] PROGRESS NOTE Subjective Pt more alert less pain meds. Reports that pain better  Objective: Vital signs in last 24 hours: Temp:  [97.6 F (36.4 C)-98.2 F (36.8 C)] 97.6 F (36.4 C) (02/27 0443) Pulse Rate:  [82-96] 82 (02/27 0443) Resp:  [16-18] 18 (02/27 0443) BP: (136-153)/(92-102) 141/96 mmHg (02/27 0443) SpO2:  [95 %-96 %] 96 % (02/27 0443) Last BM Date: 08/10/13  Intake/Output from previous day: 02/26 0701 - 02/27 0700 In: -  Out: 4800 [Urine:4800] Intake/Output this shift:    PE: Opens mouth much better less ulcerations and inflamation   Lab Results:  Recent Labs  08/13/13 0430  WBC 8.0  HGB 13.5  HCT 38.6*  PLT 334   BMET  Recent Labs  08/12/13 0512 08/13/13 0430 08/14/13 0329  NA  --  142  --   K 3.4* 3.8 3.6*  CL  --  100  --   CO2  --  26  --   CREATININE  --  0.51  --    LFT No results found for this basename: PROT, AST, ALT, ALKPHOS, BILITOT, BILIDIR, IBILI,  in the last 72 hours PT/INR No results found for this basename: LABPROT, INR,  in the last 72 hours PANCREAS No results found for this basename: LIPASE,  in the last 72 hours       Studies/Results: Dg Orthopantogram  08/12/2013   CLINICAL DATA:  Mouth pain  EXAM: ORTHOPANTOGRAM/PANORAMIC  COMPARISON:  None.  FINDINGS: Panorex view of the face shows no evidence for periapical lucency in the mandible to suggest abscess. There is evidence of cavity is in the lower posterior most teeth bilaterally. Visualized maxillary sinuses are clear.  IMPRESSION: Dental cavities in the posterior most lower teeth bilaterally.   Electronically Signed   By: Kennith CenterEric  Mansell M.D.   On: 08/12/2013 12:40    Medications: I have reviewed the patient's current medications.  Assessment/Plan: 1 Odynophagia. Due to oral ulcers inflammation, fungal smear negative. ? Viral.  Would slowly advance diet. Fruit juice and soda likely to hurt since acidic, would avoid clears and go to full  liquids, ice cream etc Please call back if we are needed.   Letrell Attwood JR,Erum Cercone L 08/14/2013, 7:13 AM

## 2013-08-14 NOTE — Progress Notes (Addendum)
PROGRESS NOTE  Chase Bush ZOX:[id number] DOB: 1981-03-31 DOA: 08/10/2013 PCP: No PCP Per Patient  HPI/Subjective: Admitted 2/3 for dysphagia/speech difficulty and tremor in upper extremity. Today reports he's feeling much better- able to open mouth, decreased dysphagia, tremors stopped after haldol lowered. Reports drooling, unwitnessed. Complains of inability to sleep.   Assessment/Plan:  Principal Problem:  Aspiration pneumonia  - Patient was admitted, continue with Unasyn and Zithromax- day 5 . Will stop the Zithromax today - Afebrile, mild leukocytosis has resolved, clinically improved.   Odynophagia.  - EGD done on 2/25-showed that information was only limited to the mouth, esophagus and stomach was normal. - Thrush vs Herpes Await biopsy results done with EGD, viral cultures pending, negative for yeast/ fungus on smear - Started on Diflucan and Acyclovir on 2/25 -orthopantogram show no significant abnormalities besides dental cavities in most lower teeth bilaterally.  - We'll check HIV, and HIV viral load- negative - Cautiously continue with dysphagia 1 diet, which slowly advance  Narcotic and Benzo abuser ? Alcohol use  - no signs of DTs - all psych meds d/c 2/27  - Counseled to quit alcohol. Dr Thedore Mins d/w [facility] RN   History of substance induced mood disorder/substance abuse  -appreciate psych consult  - all psych meds d/c 2/27  Dehydration. Resolved with Gentle IV fluids.   Severe malnutrition - in the context of chronic illness  -protein supplementation.   Reported muscle rigidity in the ER.  - Improved after Haldol dose reduced. - all psych med d/c 2/27  Disposition:  Remain inpatient most likely 1 more day  DVT Prophylaxis:  Prophylactic Heparin   Code Status: full, prison guard at bedside  Disposition Plan: inpatient 1 more day   Antibiotics: zithromax  Objective: Filed Vitals:   08/13/13 0547 08/13/13 1351 08/13/13 2052 08/14/13 0443  BP:  146/88 153/102 136/92 141/96  Pulse: 55 96 83 82  Temp: 98.2 F (36.8 C) 98.2 F (36.8 C) 98.2 F (36.8 C) 97.6 F (36.4 C)  TempSrc: Oral Oral Oral Oral  Resp: 16 16 16 18   Height:      Weight:      SpO2: 98% 95% 96% 96%    Intake/Output Summary (Last 24 hours) at 08/14/13 0930 Last data filed at 08/14/13 8119  Gross per 24 hour  Intake      0 ml  Output   5200 ml  Net  -5200 ml   Filed Weights   08/11/13 0900  Weight: 69.355 kg (152 lb 14.4 oz)    Exam: General: Well developed, well nourished, NAD, appears stated age  HEENT:  . Oral ulcers present, improved   Neck: Supple, no JVD, no masses  Cardiovascular: RRR, S1 S2 auscultated, no rubs, murmurs or gallops.   Respiratory: Clear to auscultation bilaterally with equal chest rise  Abdomen: Soft, nontender, nondistended, + bowel sounds  Extremities: warm dry without cyanosis clubbing or edema.  Psych: Normal affect and demeanor with intact judgement and insight   Data Reviewed: Basic Metabolic Panel:  Recent Labs Lab 08/10/13 1420 08/10/13 1501 08/12/13 0512 08/13/13 0430 08/14/13 0329  NA 147 145  --  142  --   K 4.0 3.8 3.4* 3.8 3.6*  CL 103 101  --  100  --   CO2 29  --   --  26  --   GLUCOSE 95 95  --  98  --   BUN 16 16  --  9  --   CREATININE 0.63 0.70  --  0.51  --   CALCIUM 9.5  --   --  9.5  --   MG  --   --  1.9 2.0 2.2  PHOS  --   --  3.6 3.5 3.8   Liver Function Tests:  Recent Labs Lab 08/10/13 1420  AST 15  ALT 15  ALKPHOS 78  BILITOT 0.7  PROT 7.5  ALBUMIN 4.0    Recent Labs Lab 08/10/13 1420  LIPASE 12   No results found for this basename: AMMONIA,  in the last 168 hours CBC:  Recent Labs Lab 08/10/13 1420 08/10/13 1501 08/13/13 0430  WBC 12.2*  --  8.0  NEUTROABS 9.2*  --   --   HGB 15.2 15.6 13.5  HCT 43.9 46.0 38.6*  MCV 89.6  --  88.1  PLT 360  --  334   Cardiac Enzymes:  Recent Labs Lab 08/10/13 1420  CKTOTAL 117   BNP (last 3 results) No results  found for this basename: PROBNP,  in the last 8760 hours CBG:  Recent Labs Lab 08/10/13 1359  GLUCAP 90    Recent Results (from the past 240 hour(s))  MRSA PCR SCREENING     Status: None   Collection Time    08/10/13  6:50 PM      Result Value Ref Range Status   MRSA by PCR NEGATIVE  NEGATIVE Final   Comment:            The GeneXpert MRSA Assay (FDA     approved for NASAL specimens     only), is one component of a     comprehensive MRSA colonization     surveillance program. It is not     intended to diagnose MRSA     infection nor to guide or     monitor treatment for     MRSA infections.  FUNGUS CULTURE W SMEAR     Status: None   Collection Time    08/12/13  9:33 AM      Result Value Ref Range Status   Specimen Description MOUTH   Final   Special Requests BRUSHING   Final   Fungal Smear     Final   Value: NO YEAST OR FUNGAL ELEMENTS SEEN     Performed at [facility]   Culture     Final   Value: Culture in Progress for 7 days     Performed at [facility]   Report Status PENDING   Incomplete     Studies: Dg Orthopantogram  08/12/2013   CLINICAL DATA:  Mouth pain  EXAM: ORTHOPANTOGRAM/PANORAMIC  COMPARISON:  None.  FINDINGS: Panorex view of the face shows no evidence for periapical lucency in the mandible to suggest abscess. There is evidence of cavity is in the lower posterior most teeth bilaterally. Visualized maxillary sinuses are clear.  IMPRESSION: Dental cavities in the posterior most lower teeth bilaterally.   Electronically Signed   By: Kennith CenterEric  Mansell M.D.   On: 08/12/2013 12:40    Scheduled Meds: . acyclovir  5 mg/kg Intravenous 3 times per day  . amLODipine  2.5 mg Oral Daily  . ampicillin-sulbactam (UNASYN) IV  1.5 g Intravenous Q6H  . azithromycin  500 mg Intravenous Q24H  . chlorhexidine  15 mL Mouth Rinse BID  . feeding supplement (ENSURE)  1 Container Oral BID BM  . fluconazole (DIFLUCAN) IV  100 mg Intravenous Q24H  . folic acid  1  mg Oral Daily  .  heparin  5,000 Units Subcutaneous 3 times per day  . LORazepam  1 mg Oral TID  . magic mouthwash w/lidocaine  5 mL Oral Q4H  . nystatin  5 mL Oral QID  . OLANZapine  10 mg Oral QHS  . thiamine  100 mg Oral Daily   Continuous Infusions:   Principal Problem:   Aspiration pneumonia Active Problems:   Dehydration   Severe protein-calorie malnutrition   Myoclonus   Parkinsonian syndrome    Elease Hashimoto, PA-S [facility] Pager [phone]. If 7PM-7AM, please contact night-coverage at [email or web address], password [facility] 08/14/2013, 9:30 AM  LOS: 4 days   Attending Seen and examined, agree with the above assessment and plan. Continues to improve, able to open mouth much more today, seems to be improving. Continue current care. I will stop all of psych meds-patient is still drooling, as change Ativan to as needed. Ambulate. Discharge in the next 2-3 days if clinical improvement continues.  Windell Norfolk MD

## 2013-08-14 NOTE — Progress Notes (Signed)
I agree with the Student-Dietitian note and made appropriate revisions.  Katie Silvino Selman, RD, LDN Pager #: [phone] After-Hours Pager #: [phone]  

## 2013-08-14 NOTE — Progress Notes (Addendum)
NUTRITION FOLLOW UP  DOCUMENTATION CODES  Per approved criteria  -Severe malnutrition in the context of chronic illness   Patient continues to meet criteria for severe malnutrition in the context of chronic illness as evidenced by >5% weight loss in 1 month and severe muscle mass depletion.   Intervention:    Ensure Complete BID between meals, each supplement provides 350 kcal and 13 grams protein   Ensure Pudding PRN with medications, each supplement provides 170 kcal and 4 grams protein  Magic Cup with meals, each supplement provides 290 kcal and 9 grams protein  If odynophagia not resolved and patient continues to eat </=50% PO, recommend initiating TF as patient is malnourished  If TF initiated recommend Jevity 1.2 via NGT at 20 ml/hr and increasing by 10 ml/hr every 8 hours to reach goal rate of 65 ml/hr to provide 1872 kcal, 87 grams protein, and 1259 ml free water.  Add Prostat BID to provide an additional 200 kcal and 30 grams protein  Above TF regimen plus Prostat provides 2072 kcal (100% of estimated needs), 117 grams protein (100% of estimated needs), and 1259 ml free water  Nutrition Dx:   Inadequate oral intake related to oral discomfort and swallowing difficulty as evidenced by reported PO intake and weight loss, ongoing  Goal:   Patient to meet >/= 90% of estimated nutrition needs, progressing  Monitor:   PO diet advancement, PO supplement acceptance, I/Os, weight trends, labs  Assessment:   Patient with past medical history of psychiatric disorders and withdrawals, [facility] as he was spitting up his med as he couldn't swallow. He's also been complaining of drooling, considerable speech difficulties , tremor in his upper extremities and lacks the ability of performing ADLs. The patient relates some cough with drinking fluids and relates that he cannot swallow.  Per conversation with nurse, patient has severe thrush in tongue that makes  it difficult to understand and communicate with the patient.  Patient reported that his usual weight is around 160-170 lbs and he started losing weight around 1 month ago. The patient stated that he has an appetite, but is unable to swallow food. Patient has had a 5-11% weight loss in one month.  Psych consult 2/24: substance abuse and induced mood disorder EGD 2/25:  inflammation limited to mouth  Patient admitted with aspiration pneumonia, treated with antibiotics.  Odynophagia related to thrush vs herpes, awaiting biopsy done with EGD  Per gastroenterology note, avoid clears (furit juice and soda acidic). Per SLP note, dys 1 with thin liquids recommended.   Per flow sheet records, PO intake poor (50%).   Patient was sleeping upon dietetic intern visit. Officer at bedside stated that patient's PO intake has been limited. Officer reported that patient takes liquids better than sold foods.   Potassium low at 3.6. Magnesium and phosphorous WNL.   Height: Ht Readings from Last 1 Encounters:  08/11/13 6\' 2"  (1.88 m)    Weight Status:   Wt Readings from Last 1 Encounters:  08/11/13 152 lb 14.4 oz (69.355 kg)  admit weight- 152 lb  Re-estimated needs:  Kcal: 1900-2100  Protein: 105-115 grams  Fluid: 1.9-2.1 L  Skin: no wounds  Diet Order: Dysphagia 1 (puree) with thin liquids   Intake/Output Summary (Last 24 hours) at 08/14/13 1149 Last data filed at 08/14/13 0900  Gross per 24 hour  Intake     80 ml  Output   5200 ml  Net  -5120 ml  Last BM: 2/23   Labs:   Recent Labs Lab 08/10/13 1420 08/10/13 1501 08/12/13 0512 08/13/13 0430 08/14/13 0329  NA 147 145  --  142  --   K 4.0 3.8 3.4* 3.8 3.6*  CL 103 101  --  100  --   CO2 29  --   --  26  --   BUN 16 16  --  9  --   CREATININE 0.63 0.70  --  0.51  --   CALCIUM 9.5  --   --  9.5  --   MG  --   --  1.9 2.0 2.2  PHOS  --   --  3.6 3.5 3.8  GLUCOSE 95 95  --  98  --     CBG (last 3)  No results found  for this basename: GLUCAP,  in the last 72 hours  Scheduled Meds: . acyclovir  5 mg/kg Intravenous 3 times per day  . amLODipine  2.5 mg Oral Daily  . ampicillin-sulbactam (UNASYN) IV  1.5 g Intravenous Q6H  . azithromycin  500 mg Intravenous Q24H  . chlorhexidine  15 mL Mouth Rinse BID  . feeding supplement (ENSURE)  1 Container Oral BID BM  . fluconazole (DIFLUCAN) IV  100 mg Intravenous Q24H  . folic acid  1 mg Oral Daily  . heparin  5,000 Units Subcutaneous 3 times per day  . magic mouthwash w/lidocaine  5 mL Oral Q4H  . nystatin  5 mL Oral QID  . OLANZapine  10 mg Oral QHS  . thiamine  100 mg Oral Daily    Continuous Infusions:   Marlane Mingle, Dietetic Intern Pager: [phone]

## 2013-08-14 NOTE — Progress Notes (Signed)
Acyclovir per pharmacy  Infectious Disease: Diflucan D#3 for thrush, Unasyn/Azith D#5 for asp PNA. 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 HSV culture pending. HIV negative. Noted pt received on oral dose of acyclovir earlier today but MD wants IV with odynophagia. +MMW and Nystatin Acyclovir>>2/25 Unasyn 2/23 Azithro 2/23 Diflucan>> 2/25   Nephrology: Scr 0.51 on 02/26; UOP 2.9 ml/kg/hr  Plan:  1. Cont acyclovir 345 mg (5mg /kg) IV q8h 2. Will f/u HSV culture, renal function, and pt's clinical condition

## 2013-08-15 LAB — PHOSPHORUS: PHOSPHORUS: 4.4 mg/dL (ref 2.3–4.6)

## 2013-08-15 LAB — MAGNESIUM: MAGNESIUM: 2.3 mg/dL (ref 1.5–2.5)

## 2013-08-15 LAB — POTASSIUM: Potassium: 3.8 mEq/L (ref 3.7–5.3)

## 2013-08-15 NOTE — Progress Notes (Signed)
PATIENT DETAILS Name: Chase Bush Age: 33 y.o. Sex: male Date of Birth: [birth year] Admit Date: 08/10/2013 Admitting Physician Marinda Elk, MD PCP:No PCP Per Patient  Subjective: Continues to improve.  Assessment/Plan: Principal Problem:   Aspiration pneumonia - Patient was admitted, has completed a 5 day course of  Unasyn and Zithromax. Currently off antibiotics - Afebrile,  mild leukocytosis has resolved, clinically improved.  Active Problems: Odynophagia. - EGD done on 2/25-showed that information was only limited to the mouth, esophagus and stomach was normal.?Thrush vs Herpes, EGD biopsy negative. Fungus and viral culture currently negative - Started on Diflucan and Acyclovir on 2/25, continue, have advanced to a dysphagia 2 diet today  - HIV negative, HIV viral load negative as well. - Cautiously continue with a full liquid diet  Narcotic and Benzo abuser ? Alcohol use  - no signs of DTs, on Ativan d as needed and folic acid thiamine. Counseled to quit alcohol. Dr Thedore Mins d[facility]w [facility]. Low dose Ativan scheduled as per regimen from prison (chronic)  History of substance induced mood disorder/substance abuse -appreciate psych consult -At this time, or psychiatric medications have been stopped-as the patient was observed to be drooling and that he sedated. Continue to monitor off medications  Dehydration. Resolved with Gentle IV fluids.  Mod PCM - protein supplementation.  Hypertension - Controlled with amlodipine  Reported muscle rigidity in the ER. - Improved after Haldol dose reduced . For now I have stopped Cogentin and Haldol completely.   Disposition: Remain inpatient-suspect discharge on 3/1  DVT Prophylaxis: Prophylactic Heparin   Code Status: Full code  Family Communication None at bedside  Procedures:  EGD on 2/25  CONSULTS:  GI and psychiatry  MEDICATIONS: Scheduled Meds: . acyclovir  5 mg/kg Intravenous 3 times per day   . amLODipine  2.5 mg Oral Daily  . chlorhexidine  15 mL Mouth Rinse BID  . feeding supplement (ENSURE COMPLETE)  237 mL Oral BID BM  . fluconazole (DIFLUCAN) IV  100 mg Intravenous Q24H  . folic acid  1 mg Oral Daily  . heparin  5,000 Units Subcutaneous 3 times per day  . magic mouthwash w/lidocaine  5 mL Oral Q4H  . nystatin  5 mL Oral QID  . thiamine  100 mg Oral Daily   Continuous Infusions:   PRN Meds:.acetaminophen (TYLENOL) oral liquid 160 mg/5 mL, feeding supplement (ENSURE), LORazepam, RESOURCE THICKENUP CLEAR, white petrolatum  Antibiotics: Anti-infectives   Start     Dose/Rate Route Frequency Ordered Stop   08/13/13 1500  fluconazole (DIFLUCAN) IVPB 100 mg     100 mg 50 mL/hr over 60 Minutes Intravenous Every 24 hours 08/12/13 1457     08/12/13 1900  acyclovir (ZOVIRAX) 345 mg in dextrose 5 % 100 mL IVPB     5 mg/kg  69.4 kg 106.9 mL/hr over 60 Minutes Intravenous 3 times per day 08/12/13 1827     08/12/13 1530  fluconazole (DIFLUCAN) IVPB 200 mg     200 mg 100 mL/hr over 60 Minutes Intravenous  Once 08/12/13 1457 08/12/13 1730   08/12/13 1515  acyclovir (ZOVIRAX) 200 MG capsule 200 mg  Status:  Discontinued     200 mg Oral 5 times daily 08/12/13 1514 08/12/13 1817   08/10/13 1930  ampicillin-sulbactam (UNASYN) 1.5 g in sodium chloride 0.9 % 50 mL IVPB  Status:  Discontinued     1.5 g 100 mL/hr over 30 Minutes Intravenous Every 6 hours 08/10/13  1832 08/14/13 1540   08/10/13 1930  azithromycin (ZITHROMAX) 500 mg in dextrose 5 % 250 mL IVPB  Status:  Discontinued     500 mg 250 mL/hr over 60 Minutes Intravenous Every 24 hours 08/10/13 1832 08/14/13 1540       PHYSICAL EXAM: Vital signs in last 24 hours: Filed Vitals:   08/14/13 1300 08/14/13 2059 08/15/13 0452 08/15/13 0925  BP: 150/106 141/86 129/88 139/96  Pulse: 96 76 66   Temp: 97.9 F (36.6 C) 98.2 F (36.8 C) 97.2 F (36.2 C)   TempSrc: Oral Oral Oral   Resp: 18 18 18    Height:      Weight:        SpO2: 97% 97% 97%     Weight change:  Filed Weights   08/11/13 0900  Weight: 69.355 kg (152 lb 14.4 oz)   Body mass index is 19.62 kg/(m^2).   Gen Exam: Awake,opening mouth much more. Neck: Supple, No JVD.   Chest: B/L Clear.  No rales CVS: S1 S2 Regular, no murmurs.  Abdomen: soft, BS +, non tender, non distended.  Extremities: no edema, lower extremities warm to touch. Neurologic: Non Focal.   Skin: No Rash.   Wounds: N/A.   Intake/Output from previous day:  Intake/Output Summary (Last 24 hours) at 08/15/13 1131 Last data filed at 08/15/13 0900  Gross per 24 hour  Intake    472 ml  Output   2275 ml  Net  -1803 ml     LAB RESULTS: CBC  Recent Labs Lab 08/10/13 1420 08/10/13 1501 08/13/13 0430  WBC 12.2*  --  8.0  HGB 15.2 15.6 13.5  HCT 43.9 46.0 38.6*  PLT 360  --  334  MCV 89.6  --  88.1  MCH 31.0  --  30.8  MCHC 34.6  --  35.0  RDW 13.5  --  13.3  LYMPHSABS 2.0  --   --   MONOABS 0.8  --   --   EOSABS 0.1  --   --   BASOSABS 0.0  --   --     Chemistries   Recent Labs Lab 08/10/13 1420 08/10/13 1501 08/12/13 0512 08/13/13 0430 08/14/13 0329 08/15/13 0600  NA 147 145  --  142  --   --   K 4.0 3.8 3.4* 3.8 3.6* 3.8  CL 103 101  --  100  --   --   CO2 29  --   --  26  --   --   GLUCOSE 95 95  --  98  --   --   BUN 16 16  --  9  --   --   CREATININE 0.63 0.70  --  0.51  --   --   CALCIUM 9.5  --   --  9.5  --   --   MG  --   --  1.9 2.0 2.2 2.3    CBG:  Recent Labs Lab 08/10/13 1359  GLUCAP 90    GFR Estimated Creatinine Clearance: 128.9 ml/min (by C-G formula based on Cr of 0.51).  Coagulation profile No results found for this basename: INR, PROTIME,  in the last 168 hours  Cardiac Enzymes No results found for this basename: CK, CKMB, TROPONINI, MYOGLOBIN,  in the last 168 hours  No components found with this basename: POCBNP,  No results found for this basename: DDIMER,  in the last 72 hours No results found for this  basename: HGBA1C,  in the last 72 hours No results found for this basename: CHOL, HDL, LDLCALC, TRIG, CHOLHDL, LDLDIRECT,  in the last 72 hours No results found for this basename: TSH, T4TOTAL, FREET3, T3FREE, THYROIDAB,  in the last 72 hours No results found for this basename: VITAMINB12, FOLATE, FERRITIN, TIBC, IRON, RETICCTPCT,  in the last 72 hours No results found for this basename: LIPASE, AMYLASE,  in the last 72 hours  Urine Studies No results found for this basename: UACOL, UAPR, USPG, UPH, UTP, UGL, UKET, UBIL, UHGB, UNIT, UROB, ULEU, UEPI, UWBC, URBC, UBAC, CAST, CRYS, UCOM, BILUA,  in the last 72 hours  MICROBIOLOGY: Recent Results (from the past 240 hour(s))  MRSA PCR SCREENING     Status: None   Collection Time    08/10/13  6:50 PM      Result Value Ref Range Status   MRSA by PCR NEGATIVE  NEGATIVE Final   Comment:            The GeneXpert MRSA Assay (FDA     approved for NASAL specimens     only), is one component of a     comprehensive MRSA colonization     surveillance program. It is not     intended to diagnose MRSA     infection nor to guide or     monitor treatment for     MRSA infections.  VIRAL CULTURE VIRC     Status: None   Collection Time    08/12/13  9:33 AM      Result Value Ref Range Status   Specimen Description TISSUE ESOPHAGUS   Final   Special Requests Normal   Final   Culture     Final   Value: Culture has been initiated.     Note:    The current Enterovirus culture system does not detect Enterovirus D68. If Enterovirus D68 is suspected, please call client services to add the test for Enterovirus PCR (Test code 16109).     Performed at [facility]   Report Status PENDING   Incomplete  FUNGUS CULTURE W SMEAR     Status: None   Collection Time    08/12/13  9:33 AM      Result Value Ref Range Status   Specimen Description MOUTH   Final   Special Requests BRUSHING   Final   Fungal Smear     Final   Value: NO YEAST OR FUNGAL ELEMENTS  SEEN     Performed at [facility]   Culture     Final   Value: Culture in Progress for 7 days     Performed at [facility]   Report Status PENDING   Incomplete    RADIOLOGY STUDIES/RESULTS: Dg Chest 2 View  08/10/2013   CLINICAL DATA:  Clinical dehydration  EXAM: CHEST  2 VIEW  COMPARISON:  DG CHEST 2V dated 08/01/2013  FINDINGS: The lungs are well-expanded. There is new mildly increased density adjacent to the cardiac apex on the frontal film which projects in the retrocardiac region on the lateral film. There is no pleural effusion or pneumothorax. The cardiac silhouette is normal in size. The pulmonary vascularity is not engorged. The mediastinum is normal in width. The observed portions of the bony thorax appear normal.  IMPRESSION: There is minimal subsegmental atelectasis or early pneumonia in the anterior aspect of the left lower lobe. Elsewhere the lung parenchyma appears clear. There is no evidence of CHF.   Electronically Signed  By: David  [place]   On: 08/10/2013 16:29   Ct Soft Tissue Neck W Contrast  08/11/2013   CLINICAL DATA:  Severe dysphagia.  Difficulty swallowing.  EXAM: CT NECK WITH CONTRAST  TECHNIQUE: Multidetector CT imaging of the neck was performed using the standard protocol following the bolus administration of intravenous contrast.  CONTRAST:  75mL OMNIPAQUE IOHEXOL 300 MG/ML  SOLN  COMPARISON:  None.  FINDINGS: Visualized intracranial contents, orbits, and sinuses are normal.  The tongue is normal. The tonsils are symmetric. Negative for peritonsillar abscess or mass. The epiglottis and larynx are normal.  The thyroid is normal. Parotid and submandibular glands are normal bilaterally.  Negative for adenopathy in the neck.  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 No acute bony abnormality. Dental caries lower molars bilaterally.  IMPRESSION: No acute abnormality.  Negative for mass or adenopathy in the neck.   Electronically Signed   By: Marlan Palau M.D.   On:  08/11/2013 09:05    Jeoffrey Massed, MD  [facility] Pager:[phone]  If 7PM-7AM, please contact night-coverage [email or web address] Password [facility] 08/15/2013, 11:31 AM   LOS: 5 days

## 2013-08-16 LAB — PHOSPHORUS: PHOSPHORUS: 4.1 mg/dL (ref 2.3–4.6)

## 2013-08-16 LAB — MAGNESIUM: Magnesium: 2.2 mg/dL (ref 1.5–2.5)

## 2013-08-16 LAB — POTASSIUM: POTASSIUM: 4.3 meq/L (ref 3.7–5.3)

## 2013-08-16 MED ORDER — THIAMINE HCL 100 MG PO TABS: 100.0000 mg | ORAL_TABLET | Freq: Every day | ORAL | Status: AC

## 2013-08-16 MED ORDER — ENSURE COMPLETE PO LIQD: 237.0000 mL | Freq: Two times a day (BID) | ORAL | Status: AC

## 2013-08-16 MED ORDER — MAGIC MOUTHWASH W/LIDOCAINE: 5.0000 mL | ORAL | Status: AC

## 2013-08-16 MED ORDER — OLANZAPINE 10 MG PO TABS: 10.0000 mg | ORAL_TABLET | Freq: Every day | ORAL | Status: AC

## 2013-08-16 MED ORDER — NYSTATIN 100000 UNIT/ML MT SUSP: 5.0000 mL | Freq: Four times a day (QID) | OROMUCOSAL | Status: AC

## 2013-08-16 MED ORDER — ACYCLOVIR 200 MG PO CAPS: 400.0000 mg | ORAL_CAPSULE | Freq: Every day | ORAL | Status: AC

## 2013-08-16 MED ORDER — LORAZEPAM 1 MG PO TABS: 1.0000 mg | ORAL_TABLET | Freq: Three times a day (TID) | ORAL | Status: AC | PRN

## 2013-08-16 MED ORDER — AMLODIPINE BESYLATE 2.5 MG PO TABS: 2.5000 mg | ORAL_TABLET | Freq: Every day | ORAL | Status: AC

## 2013-08-16 MED ORDER — FLUCONAZOLE 100 MG PO TABS
100.0000 mg | ORAL_TABLET | Freq: Every day | ORAL | Status: DC
  Administered 2013-08-16: 100 mg via ORAL
  Filled 2013-08-16: qty 1

## 2013-08-16 MED ORDER — FLUCONAZOLE 100 MG PO TABS: 100.0000 mg | ORAL_TABLET | Freq: Every day | ORAL | Status: AC

## 2013-08-16 MED ORDER — ACYCLOVIR 200 MG PO CAPS
400.0000 mg | ORAL_CAPSULE | Freq: Every day | ORAL | Status: DC
  Filled 2013-08-16 (×4): qty 2

## 2013-08-16 MED ORDER — FOLIC ACID 1 MG PO TABS: 1.0000 mg | ORAL_TABLET | Freq: Every day | ORAL | Status: AC

## 2013-08-16 NOTE — Discharge Summary (Signed)
PATIENT DETAILS Name: Chase Bush Age: 33 y.o. Sex: male Date of Birth: 05/12/1981 MRN: [id number]. Admit Date: 08/10/2013 Admitting Physician: Marinda Elk, MD PCP:No PCP Per Patient  Recommendations for Outpatient Follow-up:  1. Please follow viral and fungal cultures done during EGD-currently negative so far. Please follow till final.   PRIMARY DISCHARGE DIAGNOSIS:  Principal Problem:   Aspiration pneumonia Active Problems:   Dehydration   Severe protein-calorie malnutrition   Myoclonus   Parkinsonian syndrome      PAST MEDICAL HISTORY: Past Medical History  Diagnosis Date  . Substance abuse     was using suboxone until Jan 3rd. also hx of benzo abuse.   . Pneumonia     "when I was little; got it again now" (08/10/2013)    DISCHARGE MEDICATIONS:   Medication List    STOP taking these medications       benztropine 1 MG tablet  Commonly known as:  COGENTIN     haloperidol 10 MG tablet  Commonly known as:  HALDOL      TAKE these medications       acyclovir 200 MG capsule  Commonly known as:  ZOVIRAX  Take 2 capsules (400 mg total) by mouth 5 (five) times daily. For 7 more days from 08/16/13     amLODipine 2.5 MG tablet  Commonly known as:  NORVASC  Take 1 tablet (2.5 mg total) by mouth daily.     feeding supplement (ENSURE COMPLETE) Liqd  Take 237 mLs by mouth 2 (two) times daily between meals.     fluconazole 100 MG tablet  Commonly known as:  DIFLUCAN  Take 1 tablet (100 mg total) by mouth daily. For 7 more days from 08/16/13     folic acid 1 MG tablet  Commonly known as:  FOLVITE  Take 1 tablet (1 mg total) by mouth daily.     LORazepam 1 MG tablet  Commonly known as:  ATIVAN  Take 1 tablet (1 mg total) by mouth 3 (three) times daily as needed for anxiety or sleep.     magic mouthwash w/lidocaine Soln  Take 5 mLs by mouth every 4 (four) hours.     nystatin 100000 UNIT/ML suspension  Commonly known as:  MYCOSTATIN  Take 5 mLs  (500,000 Units total) by mouth 4 (four) times daily.     OLANZapine 10 MG tablet  Commonly known as:  ZYPREXA  Take 1 tablet (10 mg total) by mouth at bedtime.     thiamine 100 MG tablet  Take 1 tablet (100 mg total) by mouth daily.        ALLERGIES:  No Known Allergies  BRIEF HPI:  See H&P, Labs, Consult and Test reports for all details in brief, patient was admitted drooling and significant painful and difficulty swallowing, that started 10 days prior to admission, while beingincarcerated at [facility]   CONSULTATIONS:   GI  PERTINENT RADIOLOGIC STUDIES: Dg Orthopantogram  08/12/2013   CLINICAL DATA:  Mouth pain  EXAM: ORTHOPANTOGRAM/PANORAMIC  COMPARISON:  None.  FINDINGS: Panorex view of the face shows no evidence for periapical lucency in the mandible to suggest abscess. There is evidence of cavity is in the lower posterior most teeth bilaterally. Visualized maxillary sinuses are clear.  IMPRESSION: Dental cavities in the posterior most lower teeth bilaterally.   Electronically Signed   By: Kennith Center M.D.   On: 08/12/2013 12:40   Dg Chest 2 View  08/10/2013   CLINICAL DATA:  Clinical dehydration  EXAM: CHEST  2 VIEW  COMPARISON:  DG CHEST 2V dated 08/01/2013  FINDINGS: The lungs are well-expanded. There is new mildly increased density adjacent to the cardiac apex on the frontal film which projects in the retrocardiac region on the lateral film. There is no pleural effusion or pneumothorax. The cardiac silhouette is normal in size. The pulmonary vascularity is not engorged. The mediastinum is normal in width. The observed portions of the bony thorax appear normal.  IMPRESSION: There is minimal subsegmental atelectasis or early pneumonia in the anterior aspect of the left lower lobe. Elsewhere the lung parenchyma appears clear. There is no evidence of CHF.   Electronically Signed   By: David  [place]   On: 08/10/2013 16:29   Ct Soft Tissue Neck W  Contrast  08/11/2013   CLINICAL DATA:  Severe dysphagia.  Difficulty swallowing.  EXAM: CT NECK WITH CONTRAST  TECHNIQUE: Multidetector CT imaging of the neck was performed using the standard protocol following the bolus administration of intravenous contrast.  CONTRAST:  75mL OMNIPAQUE IOHEXOL 300 MG/ML  SOLN  COMPARISON:  None.  FINDINGS: Visualized intracranial contents, orbits, and sinuses are normal.  The tongue is normal. The tonsils are symmetric. Negative for peritonsillar abscess or mass. The epiglottis and larynx are normal.  The thyroid is normal. Parotid and submandibular glands are normal bilaterally.  Negative for adenopathy in the neck.  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 No acute bony abnormality. Dental caries lower molars bilaterally.  IMPRESSION: No acute abnormality.  Negative for mass or adenopathy in the neck.   Electronically Signed   By: Marlan Palau M.D.   On: 08/11/2013 09:05     PERTINENT LAB RESULTS: CBC: No results found for this basename: WBC, HGB, HCT, PLT,  in the last 72 hours CMET CMP     Component Value Date/Time   NA 142 08/13/2013 0430   K 4.3 08/16/2013 0530   CL 100 08/13/2013 0430   CO2 26 08/13/2013 0430   GLUCOSE 98 08/13/2013 0430   BUN 9 08/13/2013 0430   CREATININE 0.51 08/13/2013 0430   CALCIUM 9.5 08/13/2013 0430   PROT 7.5 08/10/2013 1420   ALBUMIN 4.0 08/10/2013 1420   AST 15 08/10/2013 1420   ALT 15 08/10/2013 1420   ALKPHOS 78 08/10/2013 1420   BILITOT 0.7 08/10/2013 1420   GFRNONAA >90 08/13/2013 0430   GFRAA >90 08/13/2013 0430    GFR Estimated Creatinine Clearance: 128.9 ml/min (by C-G formula based on Cr of 0.51). No results found for this basename: LIPASE, AMYLASE,  in the last 72 hours No results found for this basename: CKTOTAL, CKMB, CKMBINDEX, TROPONINI,  in the last 72 hours No components found with this basename: POCBNP,  No results found for this basename: DDIMER,  in the last 72 hours No results found for this basename: HGBA1C,  in the last  72 hours No results found for this basename: CHOL, HDL, LDLCALC, TRIG, CHOLHDL, LDLDIRECT,  in the last 72 hours No results found for this basename: TSH, T4TOTAL, FREET3, T3FREE, THYROIDAB,  in the last 72 hours No results found for this basename: VITAMINB12, FOLATE, FERRITIN, TIBC, IRON, RETICCTPCT,  in the last 72 hours Coags: No results found for this basename: PT, INR,  in the last 72 hours Microbiology: Recent Results (from the past 240 hour(s))  MRSA PCR SCREENING     Status: None   Collection Time    08/10/13  6:50 PM      Result Value Ref  Range Status   MRSA by PCR NEGATIVE  NEGATIVE Final   Comment:            The GeneXpert MRSA Assay (FDA     approved for NASAL specimens     only), is one component of a     comprehensive MRSA colonization     surveillance program. It is not     intended to diagnose MRSA     infection nor to guide or     monitor treatment for     MRSA infections.  VIRAL CULTURE VIRC     Status: None   Collection Time    08/12/13  9:33 AM      Result Value Ref Range Status   Specimen Description TISSUE ESOPHAGUS   Final   Special Requests Normal   Final   Culture     Final   Value: Culture has been initiated.     Note:    The current Enterovirus culture system does not detect Enterovirus D68. If Enterovirus D68 is suspected, please call client services to add the test for Enterovirus PCR (Test code [id number]).     Performed at [facility]   Report Status PENDING   Incomplete  FUNGUS CULTURE W SMEAR     Status: None   Collection Time    08/12/13  9:33 AM      Result Value Ref Range Status   Specimen Description MOUTH   Final   Special Requests BRUSHING   Final   Fungal Smear     Final   Value: NO YEAST OR FUNGAL ELEMENTS SEEN     Performed at [facility]   Culture     Final   Value: YEAST ISOLATED;ID TO FOLLOW     Performed at [facility]   Report Status PENDING   Incomplete     BRIEF HOSPITAL COURSE:   Aspiration  pneumonia  - Patient was admitted, has completed a 5 day course of Unasyn and Zithromax. Currently off antibiotics  - Afebrile, mild leukocytosis has resolved, clinically improved.  Odynophagia.  - EGD done on 2/25-showed that inflammation/ulcers was only limited to the mouth, esophagus and stomach was normal. Patient was treated for both Thrush vs Herpes with empiric Diflucan and Acylcovir with significant improvement. Initially he barely was able to open his mouth and not able to tolerate any po intake, by day of discharge able to tolerate a dysphagia 2 diet. Significantly decreased oral ulceration by day of discharge. EGD biopsy negative. Fungus and viral culture currently negative-but not final yet. - HIV negative, HIV viral load negative as well.    Narcotic and Benzo abuser ? Alcohol use  - no signs of DTs, on Ativan d as needed and folic acid thiamine. Counseled to quit alcohol. Dr Thedore MinsSingh d[facility]w [facility]. Low dose Ativan scheduled as per regimen from prison (chronic)-have changed to as needed, hopefully this can be slowly weaned  History of substance induced mood disorder/substance abuse  -appreciate psych consult  -At this time, or psychiatric medications have been stopped-as the patient was observed to be drooling and that he sedated. Will resume Zyprexa on discharge.  Dehydration. Resolved with Gentle IV fluids.   Mod PCM -  protein supplementation.   Hypertension  - Controlled with amlodipine   Reported muscle rigidity in the ER.  - Improved after Haldol dose reduced . For now I have stopped Cogentin and Haldol completely.      TODAY-DAY OF DISCHARGE:  Subjective:   Shoaib Siefker today has no headache,no chest abdominal pain,no new weakness tingling or numbness, feels much better wants to go home today. He is easily tolerating a Dysphagia 2 diet, and is anxious to be discharged today.  Objective:   Blood pressure 146/56, pulse 77, temperature 98.2 F (36.8 C),  temperature source Oral, resp. rate 20, height 6\' 2"  (1.88 m), weight 69.355 kg (152 lb 14.4 oz), SpO2 98.00%.  Intake/Output Summary (Last 24 hours) at 08/16/13 1335 Last data filed at 08/16/13 0842  Gross per 24 hour  Intake  855.2 ml  Output   2400 ml  Net -1544.8 ml   Filed Weights   08/11/13 0900  Weight: 69.355 kg (152 lb 14.4 oz)    Exam Awake Alert, Oriented *3, No new F.N deficits, Normal affect Mink[place].AT,PERRAL Supple Neck,No JVD, No cervical lymphadenopathy appriciated.  Symmetrical Chest wall movement, Good air movement bilaterally, CTAB RRR,No Gallops,Rubs or new Murmurs, No Parasternal Heave +ve B.Sounds, Abd Soft, Non tender, No organomegaly appriciated, No rebound -guarding or rigidity. No Cyanosis, Clubbing or edema, No new Rash or bruise  DISCHARGE CONDITION: Stable  DISPOSITION: Back to detention facility  DISCHARGE INSTRUCTIONS:    Activity:  As tolerated  Diet recommendation: Heart Healthy diet-Dysphagia 2 diet(Minced)       Discharge Orders   Chase Orders Complete By Expires   Call MD for:  As directed    Scheduling Instructions:     ODYNOPHAGIA/MUCOSAL ULCERATION DEVELOPS   Diet - low sodium heart healthy  As directed    Scheduling Instructions:     DYSPHAGIA 2 (MINCED DIET)   Increase activity slowly  As directed       Follow-up Information   Follow up with PRIMARY MD and Psychiatry MD . (in 3-5 days)       Total Time spent on discharge equals 45 minutes.  SignedJeoffrey Massed 08/16/2013 1:35 PM

## 2013-08-16 NOTE — Progress Notes (Addendum)
Client continues to drool and has difficulty with regular foods.  It is difficult to determine how much he understands.  Requires frequent reinforcement of ADL commands.  IV site unremarkable.  1321  Tolerated Dysphagia diet with minimal drooling.  Able to swallow pills.

## 2013-08-16 NOTE — Progress Notes (Signed)
Client discharge at 1410 in the custody of a policeman.  Stayed in room and was shackle to the bed during, able to feed self and utilize urinal.  Voided 400 cc of dark yellow urine prior to release.  Client tolerated ensure without difficulty.

## 2013-08-17 NOTE — Care Management Note (Signed)
    Page 1 of 1   08/17/2013     6:13:16 PM   CARE MANAGEMENT NOTE 08/17/2013  Patient:  Chase CrimesDALRYMPLE,Roey   Account Number:  [id number]  Date Initiated:  08/17/2013  Documentation initiated by:  Letha CapeAYLOR,Suan Pyeatt  Subjective/Objective Assessment:   dx asp pna  admit     Action/Plan:   Anticipated DC Date:  08/16/2013   Anticipated DC Plan:  [facility]      DC Planning Services  CM consult      Choice offered to / List presented to:             Status of service:  Completed, signed off Medicare Important Message given?   (If response is "NO", the following Medicare IM given date fields will be blank) Date Medicare IM given:   Date Additional Medicare IM given:    Discharge Disposition:  [facility]  Per UR Regulation:  Reviewed for med. necessity/level of care/duration of stay  If discussed at Long Length of Stay Meetings, dates discussed:    Comments:

## 2013-08-19 LAB — FUNGUS CULTURE W SMEAR: Fungal Smear: NONE SEEN

## 2013-08-24 LAB — VIRAL CULTURE VIRC: SPECIAL REQUESTS: NORMAL

## 2015-07-22 ENCOUNTER — Encounter: Payer: Self-pay | Admitting: Emergency Medicine

## 2015-07-22 ENCOUNTER — Emergency Department
Admission: EM | Admit: 2015-07-22 | Discharge: 2015-07-22 | Disposition: A | Discharge status: Home / Self Care | Payer: No Typology Code available for payment source | Attending: Emergency Medicine | Admitting: Emergency Medicine

## 2015-07-22 DIAGNOSIS — Z79899 Other long term (current) drug therapy: Secondary | ICD-10-CM | POA: Insufficient documentation

## 2015-07-22 DIAGNOSIS — F17210 Nicotine dependence, cigarettes, uncomplicated: Secondary | ICD-10-CM | POA: Insufficient documentation

## 2015-07-22 DIAGNOSIS — Y9289 Other specified places as the place of occurrence of the external cause: Secondary | ICD-10-CM | POA: Insufficient documentation

## 2015-07-22 DIAGNOSIS — Y998 Other external cause status: Secondary | ICD-10-CM | POA: Insufficient documentation

## 2015-07-22 DIAGNOSIS — F1410 Cocaine abuse, uncomplicated: Secondary | ICD-10-CM | POA: Insufficient documentation

## 2015-07-22 DIAGNOSIS — Y9389 Activity, other specified: Secondary | ICD-10-CM | POA: Insufficient documentation

## 2015-07-22 DIAGNOSIS — M79622 Pain in left upper arm: Secondary | ICD-10-CM | POA: Insufficient documentation

## 2015-07-22 DIAGNOSIS — T401X1A Poisoning by heroin, accidental (unintentional), initial encounter: Secondary | ICD-10-CM | POA: Insufficient documentation

## 2015-07-22 NOTE — ED Notes (Signed)
Pt given phone to call ride 

## 2015-07-22 NOTE — ED Notes (Signed)
AAOx3.  Skin warm and dry.  Ambulates with easy and steady gait. NAD 

## 2015-07-22 NOTE — ED Notes (Signed)
Pt here via [facility] after accidental overdose of IV heroin. EMS reports FD gave  of intranasal narcan. Bystander started CPR, pt awake, alert and oriented upon arrival to ED. C/o left upper arm pain.

## 2015-07-22 NOTE — Discharge Instructions (Signed)
Narcotic Overdose °A narcotic overdose is the misuse or overuse of a narcotic drug. A narcotic overdose can make you pass out and stop breathing. If you are not treated right away, this can cause permanent brain damage or stop your heart. Medicine may be given to reverse the effects of an overdose. If so, this medicine may bring on withdrawal symptoms. The symptoms may be abdominal cramps, throwing up (vomiting), sweating, chills, and nervousness. °Injecting narcotics can cause more problems than just an overdose. AIDS, hepatitis, and other very serious infections are transmitted by sharing needles and syringes. If you decide to quit using, there are medicines which can help you through the withdrawal period. Trying to quit all at once on your own can be uncomfortable, but not life-threatening. Call your caregiver, Narcotics Anonymous, or any drug and alcohol treatment program for further help.  °  °This information is not intended to replace advice given to you by your health care provider. Make sure you discuss any questions you have with your health care provider. °  °Document Released: 07/12/2004 Document Revised: 06/25/2014 Document Reviewed: 11/24/2014 °Elsevier Interactive Patient Education ©2016 [employer] Inc[employer] ° °

## 2015-07-22 NOTE — ED Provider Notes (Signed)
Time Seen: Approximately 0 940  I have reviewed the triage notes  Chief Complaint: Drug Overdose   History of Present Illness: Chase Bush is a 35 y.o. male who admits to heroine usage today along with cocaine separately. Patient states he has a new supply her with his heroin and patient was found unresponsive. Patient had bystander CPR and EMS gave the patient 4 mg of intranasal Narcan with symptomatic improvement. No documented loss of fluid from her blood pressure at the scene. Patient denies any physical complaints outside of some mild left upper arm pain where he was told that he fell after using the heroin. Patient states he does have a new supply her and is been known that there is some suggestion of fentanyl placed here on in the area. Patient states he's never had an issue with his heroin before which she injects in both upper extremities. He states he does not want any drug treatment program  Past Medical History  Diagnosis Date  . Substance abuse     was using suboxone until Jan 3rd. also hx of benzo abuse.   . Pneumonia     "when I was little; got it again now" (08/10/2013)    Patient Active Problem List   Diagnosis Date Noted  . Dehydration 08/10/2013  . Severe protein-calorie malnutrition (HCC) 08/10/2013  . Aspiration pneumonia (HCC) 08/10/2013  . Myoclonus 08/10/2013  . Parkinsonian syndrome (HCC) 08/10/2013    Past Surgical History  Procedure Laterality Date  . No past surgeries    . Esophagogastroduodenoscopy (egd) with propofol N/A 08/12/2013    Procedure: ESOPHAGOGASTRODUODENOSCOPY (EGD) WITH PROPOFOL;  Surgeon: Vertell Novak., MD;  Location: [facility] ENDOSCOPY;  Service: Endoscopy;  Laterality: N/A;  needs fungal culture brushing    Past Surgical History  Procedure Laterality Date  . No past surgeries    . Esophagogastroduodenoscopy (egd) with propofol N/A 08/12/2013    Procedure: ESOPHAGOGASTRODUODENOSCOPY (EGD) WITH PROPOFOL;  Surgeon: Vertell Novak., MD;  Location: [facility] ENDOSCOPY;  Service: Endoscopy;  Laterality: N/A;  needs fungal culture brushing    Current Outpatient Rx  Name  Route  Sig  Dispense  Refill  . acyclovir (ZOVIRAX) 200 MG capsule   Oral   Take 2 capsules (400 mg total) by mouth 5 (five) times daily. For 7 more days from 08/16/13 Patient not taking: Reported on 07/22/2015   35 capsule   0   . Alum & Mag Hydroxide-Simeth (MAGIC MOUTHWASH W/LIDOCAINE) SOLN   Oral   Take 5 mLs by mouth every 4 (four) hours. Patient not taking: Reported on 07/22/2015   100 mL   0   . amLODipine (NORVASC) 2.5 MG tablet   Oral   Take 1 tablet (2.5 mg total) by mouth daily. Patient not taking: Reported on 07/22/2015   30 tablet   0   . feeding supplement, ENSURE COMPLETE, (ENSURE COMPLETE) LIQD   Oral   Take 237 mLs by mouth 2 (two) times daily between meals. Patient not taking: Reported on 07/22/2015   60 Bottle   0   . fluconazole (DIFLUCAN) 100 MG tablet   Oral   Take 1 tablet (100 mg total) by mouth daily. For 7 more days from 08/16/13 Patient not taking: Reported on 07/22/2015   7 tablet   0   . folic acid (FOLVITE) 1 MG tablet   Oral   Take 1 tablet (1 mg total) by mouth daily. Patient not taking: Reported on 07/22/2015  30 tablet   0   . LORazepam (ATIVAN) 1 MG tablet   Oral   Take 1 tablet (1 mg total) by mouth 3 (three) times daily as needed for anxiety or sleep. Patient not taking: Reported on 07/22/2015   30 tablet   0   . nystatin (MYCOSTATIN) 100000 UNIT/ML suspension   Oral   Take 5 mLs (500,000 Units total) by mouth 4 (four) times daily. Patient not taking: Reported on 07/22/2015   60 mL   0   . OLANZapine (ZYPREXA) 10 MG tablet   Oral   Take 1 tablet (10 mg total) by mouth at bedtime. Patient not taking: Reported on 07/22/2015   30 tablet   0   . thiamine 100 MG tablet   Oral   Take 1 tablet (100 mg total) by mouth daily. Patient not taking: Reported on 07/22/2015   30 tablet   0      Allergies:  Bee venom  Family History: No family history on file.  Social History: Social History  Substance Use Topics  . Smoking status: Current Every Day Smoker -- 1.00 packs/day for 21 years    Types: Cigarettes  . Smokeless tobacco: Never Used  . Alcohol Use: No     Review of Systems:   10 point review of systems was performed and was otherwise negative:  Constitutional: No fever Eyes: No visual disturbances ENT: No sore throat, ear pain Cardiac: No chest pain Respiratory: No shortness of breath, wheezing, or stridor Abdomen: No abdominal pain, no vomiting, No diarrhea Endocrine: No weight loss, No night sweats Extremities: No peripheral edema, cyanosis mild left upper arm pain Skin: No rashes, easy bruising Neurologic: No focal weakness, trouble with speech or swollowing Urologic: No dysuria, Hematuria, or urinary frequency  Physical Exam:  ED Triage Vitals  Enc Vitals Group     BP 07/22/15 0918 156/101 mmHg     Pulse Rate 07/22/15 0916 97     Resp 07/22/15 0916 14     Temp 07/22/15 0916 98.1 F (36.7 C)     Temp Source 07/22/15 0916 Oral     SpO2 07/22/15 0918 95 %     Weight 07/22/15 0916 200 lb (90.719 kg)     Height 07/22/15 0916 6\' 2"  (1.88 m)     Head Cir --      Peak Flow --      Pain Score 07/22/15 0917 6     Pain Loc --      Pain Edu? --      Excl. in GC? --     General: Awake , Alert , and Oriented times 3; GCS 15 Head: Normal cephalic , atraumatic Eyes: Pupils equal , round, reactive to light Nose/Throat: No nasal drainage, patent upper airway without erythema or exudate.  Neck: Supple, Full range of motion, No anterior adenopathy or palpable thyroid masses Lungs: Clear to ascultation without wheezes , rhonchi, or rales Heart: Regular rate, regular rhythm without murmurs , gallops , or rubs Abdomen: Soft, non tender without rebound, guarding , or rigidity; bowel sounds positive and symmetric in all 4 quadrants. No organomegaly .         Extremities: Full range of motion of his left arm without any focal tenderness. 2 plus symmetric pulses. No edema, clubbing or cyanosis Neurologic: normal ambulation, Motor symmetric without deficits, sensory intact Skin: warm, dry, no rashes   EKG: * ED ECG REPORT I, Jennye Moccasin, the attending physician, personally viewed and  interpreted this ECG.  Date: 07/22/2015 EKG Time: 0916 Rate: 99 Rhythm: normal sinus rhythm QRS Axis: normal Intervals: normal ST/T Wave abnormalities: normal Conduction Disturbances: Borderline prolonged QT interval Narrative Interpretation: unremarkable No acute ischemic changes    ED Course: * Patient's stay here was uneventful and he was observed for any rebound effects of his heroin which there was none. Patient again declines on any drug treatment program. He states he did not ingest any heroin with intent to harm himself and I felt there was no reason to keep the patient here any longer in emergency department. He denies any physical complaints and was advised of course to avoid any illicit drugs in the future.   Assessment: Acute heroin injection  Final Clinical Impression:  Final diagnoses:  Heroin overdose, accidental or unintentional, initial encounter     Plan: * Outpatient management Patient was advised to return immediately if condition worsens. Patient was advised to follow up with their primary care physician or other specialized physicians involved in their outpatient care            Jennye Moccasin, MD 07/22/15 1718

## 2015-07-22 NOTE — ED Notes (Signed)
Pt resting quietly in room, no distress noted. Even and nonlabored respirations noted, will continue to monitor.

## 2018-07-28 ENCOUNTER — Emergency Department
Admission: EM | Admit: 2018-07-28 | Discharge: 2018-07-28 | Disposition: A | Discharge status: Home / Self Care | Payer: No Typology Code available for payment source | Attending: Emergency Medicine | Admitting: Emergency Medicine

## 2018-07-28 ENCOUNTER — Encounter: Payer: Self-pay | Admitting: Emergency Medicine

## 2018-07-28 DIAGNOSIS — F17210 Nicotine dependence, cigarettes, uncomplicated: Secondary | ICD-10-CM | POA: Insufficient documentation

## 2018-07-28 DIAGNOSIS — K047 Periapical abscess without sinus: Secondary | ICD-10-CM | POA: Insufficient documentation

## 2018-07-28 MED ORDER — LIDOCAINE VISCOUS HCL 2 % MT SOLN
15.0000 mL | Freq: Once | OROMUCOSAL | Status: AC
  Administered 2018-07-28: 15 mL via OROMUCOSAL
  Filled 2018-07-28: qty 15

## 2018-07-28 MED ORDER — AMOXICILLIN 500 MG PO CAPS: 500.0000 mg | ORAL_CAPSULE | Freq: Three times a day (TID) | ORAL | 0 refills | Status: AC

## 2018-07-28 MED ORDER — LIDOCAINE VISCOUS HCL 2 % MT SOLN: 10.0000 mL | OROMUCOSAL | 0 refills | Status: AC | PRN

## 2018-07-28 MED ORDER — AMOXICILLIN 500 MG PO CAPS
500.0000 mg | ORAL_CAPSULE | Freq: Once | ORAL | Status: AC
  Administered 2018-07-28: 500 mg via ORAL
  Filled 2018-07-28: qty 1

## 2018-07-28 MED ORDER — KETOROLAC TROMETHAMINE 30 MG/ML IJ SOLN
30.0000 mg | Freq: Once | INTRAMUSCULAR | Status: AC
  Administered 2018-07-28: 30 mg via INTRAMUSCULAR
  Filled 2018-07-28: qty 1

## 2018-07-28 NOTE — ED Triage Notes (Signed)
Patient presents to the ED with swelling to his left cheek.  Patient reports severe pain x 2 days to tooth on the left side of his mouth.  Patient is in no obvious distress at this time.

## 2018-07-28 NOTE — Discharge Instructions (Signed)
OPTIONS FOR DENTAL FOLLOW UP CARE ° °[place] Department of Health and Human Services - Local Safety Net Dental Clinics °http://www.ncdhhs.gov/dph/oralhealth/services/safetynetclinics.htm °  °[facility] (336-562-3123) ° °[facility] [place] (919-933-9087) ° °[facility] (919-663-1744 ext [phone]) ° °[facility] ([phone]) ° °[facility] ([phone]) °This clinic caters to the indigent population and is on a lottery system. °Location: °[facility], [address], [place] °Clinic Hours: °Wednesdays from 6pm - 9pm, patients seen by a lottery system. °For dates, call or go to www.med.[facility].edu/[facility]/patients/Dental-[facility] °Services: °Cleanings, fillings and simple extractions. °Payment Options: °DENTAL WORK IS FREE OF CHARGE. Bring proof of income or support. °Best way to get seen: °Arrive at 5:15 pm - this is a lottery, NOT first come/first serve, so arriving earlier will not increase your chances of being seen. °  °  °[facility] °[phone] °Select option 1 for emergencies °  °Location: °[facility], [address], [place] °Clinic Hours: °No walk-ins accepted - call the day before to schedule an appointment. °Check in times are 9:30 am and 1:30 pm. °Services: °Simple extractions, temporary fillings, pulpectomy/pulp debridement, uncomplicated abscess drainage. °Payment Options: °PAYMENT IS DUE AT THE TIME OF SERVICE.  Fee is usually $100-200, additional surgical procedures (e.g. abscess drainage) may be extra. °Cash, checks, Visa/MasterCard accepted.  Can file Medicaid if patient is covered for dental - patient should call case worker to check. °No discount for [facility] patients. °Best way to get seen: °MUST call the day before and get onto the schedule. Can usually be seen the next 1-2 days. No walk-ins accepted. °  °  °[facility] °[phone] °   °Location: °[facility], [address], [place] °Clinic Hours: °M, W, Th, F 8am or 1:30pm, Tues 9a or 1:30 - first come/first served. °Services: °Simple extractions, temporary fillings, uncomplicated abscess drainage.  You do not need to be an [place] resident. °Payment Options: °PAYMENT IS DUE AT THE TIME OF SERVICE. °Dental insurance, otherwise sliding scale - bring proof of income or support. °Depending on income and treatment needed, cost is usually $50-200. °Best way to get seen: °Arrive early as it is first come/first served. °  °  °[facility] °[phone] °  °Location: °[address] °Clinic Hours: °Mon-Thu 8a-5p °Services: °Most basic dental services including extractions and fillings. °Payment Options: °PAYMENT IS DUE AT THE TIME OF SERVICE. °Sliding scale, up to 50% off - bring proof if income or support. °Medicaid with dental option accepted. °Best way to get seen: °Call to schedule an appointment, can usually be seen within 2 weeks OR they will try to see walk-ins - show up at 8a or 2p (you may have to wait). °  °  °[facility] °[phone] °[place] RESIDENTS ONLY °  °Location: °[facility], [address], [place], [place] [address] °Clinic Hours: By appointment only. °Monday - Thursday 8am-5pm, Friday 8am-12pm °Services: Cleanings, fillings, extractions. °Payment Options: °PAYMENT IS DUE AT THE TIME OF SERVICE. °Cash, Visa or MasterCard. Sliding scale - $30 minimum per service. °Best way to get seen: °Come in to office, complete packet and make an appointment - need proof of income °or support monies for each household member and proof of [place] residence. °Usually takes about a month to get in. °  °  °[facility] °[phone] °  °Location: °[address]   [place] °Clinic Hours: Walk-in Urgent Care Dental Services are offered Monday-Friday  mornings only. °The numbers of emergencies accepted daily is limited to the number of °providers available. °Maximum 15 - Mondays, Wednesdays & Thursdays °Maximum 10 - Tuesdays & Fridays °Services: °You do not need to be a [place] resident to be seen for a dental emergency. °Emergencies are defined as pain, swelling, abnormal bleeding, or dental trauma. Walkins will receive x-rays if needed. °NOTE: Dental cleaning is not an emergency. °Payment Options: °PAYMENT IS DUE AT THE TIME OF SERVICE. °Minimum co-pay is $40.00 for uninsured patients. °Minimum co-pay is $3.00 for Medicaid with dental coverage. °Dental Insurance is accepted and must be presented at time of visit. °Medicare does not cover dental. °Forms of payment: Cash, credit card, checks. °Best way to get seen: °If not previously registered with the clinic, walk-in dental registration begins at 7:15 am and is on a first come/first serve basis. °If previously registered with the clinic, call to make an appointment. °  °  °[facility] °[phone] °[place] RESIDENTS ONLY °  °Location: °[address], [place], [place] °Clinic Hours: °Mon-Thu 10a-2p °Services: Extractions only! °Payment Options: °FREE (donations accepted) - bring proof of income or support °Best way to get seen: °Call and schedule an appointment OR come at 8am on the 1st Monday of every month (except for holidays) when it is first come/first served. °  °  °[facility] °[phone] °  °Location: °[address], [place] °Clinic Hours: °Friday mornings °Services, Payment Options, Best way to get seen: °Call for info °

## 2018-07-28 NOTE — ED Provider Notes (Signed)
[facility] Emergency Department Provider Note  ____________________________________________  Time seen: Approximately 4:36 PM  I have reviewed the triage vital signs and the nursing notes.   HISTORY  Chief Complaint Dental Pain    HPI Chase Bush is a 38 y.o. male that presents to the emergency department for evaluation of left dental pain for 2 days.  He states that there is a cavity in 1 of his back bottom teeth.  Pain woke him up last night.  Patient states that he had an abscess on the other side about 1 week ago.  No fever, chills, difficulty opening or closing mouth.  He does not have a Education officer, community.   Past Medical History:  Diagnosis Date  . Pneumonia    "when I was little; got it again now" (08/10/2013)  . Substance abuse (HCC)    was using suboxone until Jan 3rd. also hx of benzo abuse.     Patient Active Problem List   Diagnosis Date Noted  . Dehydration 08/10/2013  . Severe protein-calorie malnutrition (HCC) 08/10/2013  . Aspiration pneumonia (HCC) 08/10/2013  . Myoclonus 08/10/2013  . Parkinsonian syndrome (HCC) 08/10/2013    Past Surgical History:  Procedure Laterality Date  . ESOPHAGOGASTRODUODENOSCOPY (EGD) WITH PROPOFOL N/A 08/12/2013   Procedure: ESOPHAGOGASTRODUODENOSCOPY (EGD) WITH PROPOFOL;  Surgeon: Vertell Novak., MD;  Location: [facility] ENDOSCOPY;  Service: Endoscopy;  Laterality: N/A;  needs fungal culture brushing  . NO PAST SURGERIES      Prior to Admission medications   Medication Sig Start Date End Date Taking? Authorizing Provider  acyclovir (ZOVIRAX) 200 MG capsule Take 2 capsules (400 mg total) by mouth 5 (five) times daily. For 7 more days from 08/16/13 Patient not taking: Reported on 07/22/2015 08/16/13   Maretta Bees, MD  Alum & Mag Hydroxide-Simeth (MAGIC MOUTHWASH W/LIDOCAINE) SOLN Take 5 mLs by mouth every 4 (four) hours. Patient not taking: Reported on 07/22/2015 08/16/13   Maretta Bees, MD  amLODipine  (NORVASC) 2.5 MG tablet Take 1 tablet (2.5 mg total) by mouth daily. Patient not taking: Reported on 07/22/2015 08/16/13   Maretta Bees, MD  amoxicillin (AMOXIL) 500 MG capsule Take 1 capsule (500 mg total) by mouth 3 (three) times daily. 07/28/18   Enid Derry, PA-C  feeding supplement, ENSURE COMPLETE, (ENSURE COMPLETE) LIQD Take 237 mLs by mouth 2 (two) times daily between meals. Patient not taking: Reported on 07/22/2015 08/16/13   Maretta Bees, MD  fluconazole (DIFLUCAN) 100 MG tablet Take 1 tablet (100 mg total) by mouth daily. For 7 more days from 08/16/13 Patient not taking: Reported on 07/22/2015 08/16/13   Maretta Bees, MD  folic acid (FOLVITE) 1 MG tablet Take 1 tablet (1 mg total) by mouth daily. Patient not taking: Reported on 07/22/2015 08/16/13   Maretta Bees, MD  lidocaine (XYLOCAINE) 2 % solution Use as directed 10 mLs in the mouth or throat as needed for mouth pain. 07/28/18   Enid Derry, PA-C  LORazepam (ATIVAN) 1 MG tablet Take 1 tablet (1 mg total) by mouth 3 (three) times daily as needed for anxiety or sleep. Patient not taking: Reported on 07/22/2015 08/16/13   Maretta Bees, MD  nystatin (MYCOSTATIN) 100000 UNIT/ML suspension Take 5 mLs (500,000 Units total) by mouth 4 (four) times daily. Patient not taking: Reported on 07/22/2015 08/16/13   Maretta Bees, MD  OLANZapine (ZYPREXA) 10 MG tablet Take 1 tablet (10 mg total) by mouth at bedtime. Patient not  taking: Reported on 07/22/2015 08/16/13   Maretta Bees, MD  thiamine 100 MG tablet Take 1 tablet (100 mg total) by mouth daily. Patient not taking: Reported on 07/22/2015 08/16/13   Maretta Bees, MD    Allergies Bee venom  No family history on file.  Social History Social History   Tobacco Use  . Smoking status: Current Every Day Smoker    Packs/day: 1.00    Years: 21.00    Pack years: 21.00    Types: Cigarettes  . Smokeless tobacco: Never Used  Substance Use Topics  . Alcohol use: No  .  Drug use: Yes    Comment: 08/10/2013 "last drug use was 06/17/2013"     Review of Systems  Constitutional: No fever/chills Cardiovascular: No chest pain. Respiratory: No SOB. Gastrointestinal:  No nausea, no vomiting.  Musculoskeletal: Negative for musculoskeletal pain. Skin: Negative for rash, abrasions, lacerations, ecchymosis. Neurological: Negative for headaches, numbness or tingling   ____________________________________________   PHYSICAL EXAM:  VITAL SIGNS: ED Triage Vitals  Enc Vitals Group     BP 07/28/18 1625 (!) 171/99     Pulse Rate 07/28/18 1625 89     Resp 07/28/18 1625 18     Temp 07/28/18 1625 99 F (37.2 C)     Temp Source 07/28/18 1625 Oral     SpO2 07/28/18 1625 100 %     Weight 07/28/18 1626 218 lb (98.9 kg)     Height 07/28/18 1626 6\' 2"  (1.88 m)     Head Circumference --      Peak Flow --      Pain Score 07/28/18 1626 7     Pain Loc --      Pain Edu? --      Excl. in GC? --      Constitutional: Alert and oriented. Well appearing and in no acute distress. Eyes: Conjunctivae are normal. PERRL. EOMI. Head: Atraumatic. ENT:      Ears:      Nose: No congestion/rhinnorhea.      Mouth/Throat: Mucous membranes are moist.  Neck: No stridor.   Cardiovascular: Normal rate, regular rhythm.  Good peripheral circulation.  Large cavity to left bottom molar. Respiratory: Normal respiratory effort without tachypnea or retractions. Lungs CTAB. Good air entry to the bases with no decreased or absent breath sounds. Musculoskeletal: Full range of motion to all extremities. No gross deformities appreciated. Neurologic:  Normal speech and language. No gross focal neurologic deficits are appreciated.  Skin:  Skin is warm, dry and intact. No rash noted. Psychiatric: Mood and affect are normal. Speech and behavior are normal. Patient exhibits appropriate insight and judgement.   ____________________________________________   LABS (all labs ordered are listed,  but only abnormal results are displayed)  Labs Reviewed - No data to display ____________________________________________  EKG   ____________________________________________  RADIOLOGY   No results found.  ____________________________________________    PROCEDURES  Procedure(s) performed:    Procedures    Medications  ketorolac (TORADOL) 30 MG/ML injection 30 mg (30 mg Intramuscular Given 07/28/18 1651)  lidocaine (XYLOCAINE) 2 % viscous mouth solution 15 mL (15 mLs Mouth/Throat Given 07/28/18 1651)  amoxicillin (AMOXIL) capsule 500 mg (500 mg Oral Given 07/28/18 1650)     ____________________________________________   INITIAL IMPRESSION / ASSESSMENT AND PLAN / ED COURSE  Pertinent labs & imaging results that were available during my care of the patient were reviewed by me and considered in my medical decision making (see chart for details).  Review  of the  [place]RS was performed in accordance of the [facility] prior to dispensing any controlled drugs.     Patient's diagnosis is consistent with dental abscess.  Patient was given IM Toradol and viscous lidocaine for pain.  Patient will be discharged home with prescriptions for amoxicillin. Patient is to follow up with dentist as directed.  Dental resources were given.  Patient is given ED precautions to return to the ED for any worsening or new symptoms.     ____________________________________________  FINAL CLINICAL IMPRESSION(S) / ED DIAGNOSES  Final diagnoses:  Dental abscess      NEW MEDICATIONS STARTED DURING THIS VISIT:  ED Discharge Orders         Ordered    amoxicillin (AMOXIL) 500 MG capsule  3 times daily     07/28/18 1637    lidocaine (XYLOCAINE) 2 % solution  As needed     07/28/18 1637              This chart was dictated using voice recognition software/Dragon. Despite best efforts to proofread, errors can occur which can change the meaning. Any change was purely unintentional.     Enid DerryWagner, Intisar Claudio, PA-C 07/28/18 2219    Nita SickleVeronese, Rigby, MD 07/28/18 2325

## 2019-12-14 ENCOUNTER — Encounter (HOSPITAL_COMMUNITY): Payer: Self-pay | Admitting: Emergency Medicine

## 2019-12-14 ENCOUNTER — Emergency Department (HOSPITAL_COMMUNITY)
Admission: EM | Admit: 2019-12-14 | Discharge: 2019-12-14 | Disposition: A | Discharge status: Home / Self Care | Payer: Self-pay | Attending: Emergency Medicine | Admitting: Emergency Medicine

## 2019-12-14 ENCOUNTER — Other Ambulatory Visit: Payer: Self-pay

## 2019-12-14 DIAGNOSIS — H00035 Abscess of left lower eyelid: Secondary | ICD-10-CM | POA: Insufficient documentation

## 2019-12-14 DIAGNOSIS — H00036 Abscess of eyelid left eye, unspecified eyelid: Secondary | ICD-10-CM

## 2019-12-14 DIAGNOSIS — F17210 Nicotine dependence, cigarettes, uncomplicated: Secondary | ICD-10-CM | POA: Insufficient documentation

## 2019-12-14 NOTE — ED Notes (Signed)
Patient verbalizes understanding of discharge instructions. Opportunity for questioning and answers were provided. Armband removed by staff, pt discharged from ED.  

## 2019-12-14 NOTE — ED Triage Notes (Signed)
Pt reports pain, watering and redness to L eye x2-3 days, states he thought he got some metal into his eye, states he picked a scan that was on his lower eyelid and some purulent drainage came out, denies visual changes.

## 2019-12-14 NOTE — ED Provider Notes (Signed)
[facility] EMERGENCY DEPARTMENT Provider Note   CSN: [id number] Arrival date & time: 12/14/19  0701     History Chief Complaint  Patient presents with   Eye Problem    Chase Bush is a 39 y.o. male.  HPI Patient presents with left pain and swelling.  Pain is actually on left eyelid.  States he thinks he may have some metal on the eyelid.  States it has been swollen.  States was clearly cold and some pus came out.  Has become more swollen and more painful.  No fevers.  No direct eyeball pain.  No real pain with movement of the.  No fevers.    Past Medical History:  Diagnosis Date   Pneumonia    "when I was little; got it again now" (08/10/2013)   Substance abuse [facility])    was using suboxone until Jan 3rd. also hx of benzo abuse.     Patient Active Problem List   Diagnosis Date Noted   Dehydration 08/10/2013   Severe protein-calorie malnutrition ([place]) 08/10/2013   Aspiration pneumonia ([place]) 08/10/2013   Myoclonus 08/10/2013   Parkinsonian syndrome ([place]) 08/10/2013    Past Surgical History:  Procedure Laterality Date   ESOPHAGOGASTRODUODENOSCOPY (EGD) WITH PROPOFOL N/A 08/12/2013   Procedure: ESOPHAGOGASTRODUODENOSCOPY (EGD) WITH PROPOFOL;  Surgeon: Winfield Cunas., MD;  Location[facility] END[facility]COPY;  Service: Endoscopy;  Laterality: N/A;  needs fungal culture brushing   NO PAST SURGERIES         No family history on file.  Social History   Tobacco Use   Smoking status: Current Every Day Smoker    Packs/day: 1.00    Years: 21.00    Pack years: 21.00    Types: Cigarettes   Smokeless tobacco: Never Used  Substance Use Topics   Alcohol use: No   Drug use: Yes    Comment: 08/10/2013 "last drug use was 06/17/2013"    Home Medications Prior to Admission medications   Medication Sig Start Date End Date Taking? Authorizing Provider  acyclovir (ZOVIRAX) 200 MG capsule Take 2 capsules (400 mg total) by mouth 5 (five) times  daily. For 7 more days from 08/16/13 Patient not taking: Reported on 07/22/2015 08/16/13   Jonetta Osgood, MD  Alum & Mag Hydroxide-Simeth (MAGIC MOUTHWASH W/LIDOCAINE) SOLN Take 5 mLs by mouth every 4 (four) hours. Patient not taking: Reported on 07/22/2015 08/16/13   Jonetta Osgood, MD  amLODipine (NORVASC) 2.5 MG tablet Take 1 tablet (2.5 mg total) by mouth daily. Patient not taking: Reported on 07/22/2015 08/16/13   Jonetta Osgood, MD  amoxicillin (AMOXIL) 500 MG capsule Take 1 capsule (500 mg total) by mouth 3 (three) times daily. 07/28/18   Laban Emperor, PA-C  feeding supplement, ENSURE COMPLETE, (ENSURE COMPLETE) LIQD Take 237 mLs by mouth 2 (two) times daily between meals. Patient not taking: Reported on 07/22/2015 08/16/13   Jonetta Osgood, MD  fluconazole (DIFLUCAN) 100 MG tablet Take 1 tablet (100 mg total) by mouth daily. For 7 more days from 08/16/13 Patient not taking: Reported on 07/22/2015 08/16/13   Jonetta Osgood, MD  folic acid (FOLVITE) 1 MG tablet Take 1 tablet (1 mg total) by mouth daily. Patient not taking: Reported on 07/22/2015 08/16/13   Jonetta Osgood, MD  lidocaine (XYLOCAINE) 2 % solution Use as directed 10 mLs in the mouth or throat as needed for mouth pain. 07/28/18   Laban Emperor, PA-C  LORazepam (ATIVAN) 1 MG tablet Take  1 tablet (1 mg total) by mouth 3 (three) times daily as needed for anxiety or sleep. Patient not taking: Reported on 07/22/2015 08/16/13   Maretta Bees, MD  nystatin (MYCOSTATIN) 100000 UNIT/ML suspension Take 5 mLs (500,000 Units total) by mouth 4 (four) times daily. Patient not taking: Reported on 07/22/2015 08/16/13   Maretta Bees, MD  OLANZapine (ZYPREXA) 10 MG tablet Take 1 tablet (10 mg total) by mouth at bedtime. Patient not taking: Reported on 07/22/2015 08/16/13   Maretta Bees, MD  thiamine 100 MG tablet Take 1 tablet (100 mg total) by mouth daily. Patient not taking: Reported on 07/22/2015 08/16/13   Maretta Bees, MD     Allergies    Bee venom  Review of Systems   Review of Systems  Constitutional: Negative for appetite change.  HENT:       Eyelid swelling  Eyes: Negative for photophobia, discharge, redness and visual disturbance.  Respiratory: Negative for shortness of breath.   Cardiovascular: Negative for chest pain.  Neurological: Negative for weakness.    Physical Exam Updated Vital Signs BP 122/80 (BP Location: Right Arm)    Pulse 91    Temp 98.2 F (36.8 C) (Oral)    Resp 16    SpO2 99%   Physical Exam Vitals reviewed.  HENT:     Head: Atraumatic.  Eyes:     Comments: Swelling of left lower eyelid.  Some erythema.  More along the middle of the eyelid there is a scab.  There is swelling or induration medially movements intact.  No globe tenderness or swelling.    Skin:    General: Skin is warm.  Neurological:     Mental Status: He is alert and oriented to person, place, and time.     ED Results / Procedures / Treatments   Labs (all labs ordered are listed, but only abnormal results are displayed) Labs Reviewed - No data to display  EKG None  Radiology No results found.  Procedures Procedures (including critical care time)  Medications Ordered in ED Medications - No data to display  ED Course  I have reviewed the triage vital signs and the nursing notes.  Pertinent labs & imaging results that were available during my care of the patient were reviewed by me and considered in my medical decision making (see chart for details).    MDM Rules/Calculators/A&P                          Patient with swollen eyelid.  Likely abscess versus stye.  However with pain will have ophthalmology follow-up.  Discussed with scheduler and will go to [facility] ophthalmology now. Final Clinical Impression(s) / ED Diagnoses Final diagnoses:  Eyelid abscess, left    Rx / DC Orders ED Discharge Orders    None       Benjiman Core, MD 12/14/19 [phone]

## 2020-07-08 ENCOUNTER — Emergency Department (HOSPITAL_COMMUNITY): Payer: Self-pay

## 2020-07-08 ENCOUNTER — Other Ambulatory Visit: Payer: Self-pay

## 2020-07-08 ENCOUNTER — Encounter (HOSPITAL_COMMUNITY): Payer: Self-pay | Admitting: Emergency Medicine

## 2020-07-08 ENCOUNTER — Emergency Department (HOSPITAL_COMMUNITY)
Admission: EM | Admit: 2020-07-08 | Discharge: 2020-07-08 | Disposition: A | Discharge status: Home / Self Care | Payer: Self-pay | Attending: Emergency Medicine | Admitting: Emergency Medicine

## 2020-07-08 DIAGNOSIS — F17210 Nicotine dependence, cigarettes, uncomplicated: Secondary | ICD-10-CM | POA: Insufficient documentation

## 2020-07-08 DIAGNOSIS — S62109A Fracture of unspecified carpal bone, unspecified wrist, initial encounter for closed fracture: Secondary | ICD-10-CM

## 2020-07-08 DIAGNOSIS — W009XXA Unspecified fall due to ice and snow, initial encounter: Secondary | ICD-10-CM | POA: Insufficient documentation

## 2020-07-08 DIAGNOSIS — S52502A Unspecified fracture of the lower end of left radius, initial encounter for closed fracture: Secondary | ICD-10-CM | POA: Insufficient documentation

## 2020-07-08 MED ORDER — PROPOFOL 10 MG/ML IV BOLUS
INTRAVENOUS | Status: AC | PRN
  Administered 2020-07-08: 40 mg via INTRAVENOUS
  Administered 2020-07-08: 20 mg via INTRAVENOUS

## 2020-07-08 MED ORDER — HYDROCODONE-ACETAMINOPHEN 5-325 MG PO TABS: 1.0000 | ORAL_TABLET | ORAL | 0 refills | Status: AC | PRN

## 2020-07-08 MED ORDER — OXYCODONE-ACETAMINOPHEN 5-325 MG PO TABS
1.0000 | ORAL_TABLET | Freq: Once | ORAL | Status: AC
  Administered 2020-07-08: 1 via ORAL
  Filled 2020-07-08: qty 1

## 2020-07-08 MED ORDER — PROPOFOL 10 MG/ML IV BOLUS
INTRAVENOUS | Status: AC
  Filled 2020-07-08: qty 20

## 2020-07-08 MED ORDER — BUPIVACAINE HCL (PF) 0.5 % IJ SOLN
10.0000 mL | Freq: Once | INTRAMUSCULAR | Status: AC
  Administered 2020-07-08: 10 mL
  Filled 2020-07-08: qty 10

## 2020-07-08 MED ORDER — HYDROMORPHONE HCL 1 MG/ML IJ SOLN
0.5000 mg | Freq: Once | INTRAMUSCULAR | Status: AC
  Administered 2020-07-08: 0.5 mg via INTRAVENOUS
  Filled 2020-07-08: qty 1

## 2020-07-08 MED ORDER — PROPOFOL 10 MG/ML IV BOLUS
INTRAVENOUS | Status: AC | PRN
  Administered 2020-07-08: 30 mg via INTRAVENOUS

## 2020-07-08 MED ORDER — PROPOFOL 10 MG/ML IV BOLUS
40.0000 mg | Freq: Once | INTRAVENOUS | Status: DC
  Filled 2020-07-08: qty 20

## 2020-07-08 MED ORDER — NAPROXEN 500 MG PO TABS: 500.0000 mg | ORAL_TABLET | Freq: Two times a day (BID) | ORAL | 0 refills | Status: AC

## 2020-07-08 MED ORDER — PROPOFOL 10 MG/ML IV BOLUS
INTRAVENOUS | Status: AC | PRN
  Administered 2020-07-08: 20 mg via INTRAVENOUS
  Administered 2020-07-08: 30 mg via INTRAVENOUS
  Administered 2020-07-08: 40 mg via INTRAVENOUS
  Administered 2020-07-08 (×3): 20 mg via INTRAVENOUS

## 2020-07-08 NOTE — ED Provider Notes (Signed)
[facility] EMERGENCY DEPARTMENT Provider Note   CSN: [id number] Arrival date & time: 07/08/20  9024     History Chief Complaint  Patient presents with  . Fall    Chase Bush is a 40 y.o. male with a past medical history significant for polysubstance abuse and Parkinsonian syndrome who presents to the ED due to left wrist and hand pain after mechanical fall that occurred yesterday.  Patient states he slipped on ice landing directly on his left hand.  Denies head injury loss of consciousness.  He is not currently on any blood thinners.  Patient admits to 10/10 left wrist/hand pain worse with movement. Pain associated with edema. Denies associated numbness/tingling.  Pain also radiates up to mid forearm.  Denies left shoulder and elbow pain.  He has been taking ibuprofen with mild relief. He is right handed. No previous left hand/wrist injury.   History obtained from patient and past medical records. No interpreter used during encounter.      Past Medical History:  Diagnosis Date  . Pneumonia    "when I was little; got it again now" (08/10/2013)  . Substance abuse (HCC)    was using suboxone until Jan 3rd. also hx of benzo abuse.     Patient Active Problem List   Diagnosis Date Noted  . Dehydration 08/10/2013  . Severe protein-calorie malnutrition (HCC) 08/10/2013  . Aspiration pneumonia (HCC) 08/10/2013  . Myoclonus 08/10/2013  . Parkinsonian syndrome (HCC) 08/10/2013    Past Surgical History:  Procedure Laterality Date  . ESOPHAGOGASTRODUODENOSCOPY (EGD) WITH PROPOFOL N/A 08/12/2013   Procedure: ESOPHAGOGASTRODUODENOSCOPY (EGD) WITH PROPOFOL;  Surgeon: Vertell Novak., MD;  Location: [facility] ENDOSCOPY;  Service: Endoscopy;  Laterality: N/A;  needs fungal culture brushing  . NO PAST SURGERIES         No family history on file.  Social History   Tobacco Use  . Smoking status: Current Every Day Smoker    Packs/day: 1.00    Years: 21.00     Pack years: 21.00    Types: Cigarettes  . Smokeless tobacco: Never Used  Substance Use Topics  . Alcohol use: No  . Drug use: Yes    Comment: 08/10/2013 "last drug use was 06/17/2013"    Home Medications Prior to Admission medications   Medication Sig Start Date End Date Taking? Authorizing Provider  acyclovir (ZOVIRAX) 200 MG capsule Take 2 capsules (400 mg total) by mouth 5 (five) times daily. For 7 more days from 08/16/13 Patient not taking: No sig reported 08/16/13   Maretta Bees, MD  Alum & Mag Hydroxide-Simeth (MAGIC MOUTHWASH W/LIDOCAINE) SOLN Take 5 mLs by mouth every 4 (four) hours. Patient not taking: No sig reported 08/16/13   Maretta Bees, MD  amLODipine (NORVASC) 2.5 MG tablet Take 1 tablet (2.5 mg total) by mouth daily. Patient not taking: No sig reported 08/16/13   Maretta Bees, MD  amoxicillin (AMOXIL) 500 MG capsule Take 1 capsule (500 mg total) by mouth 3 (three) times daily. Patient not taking: No sig reported 07/28/18   Enid Derry, PA-C  feeding supplement, ENSURE COMPLETE, (ENSURE COMPLETE) LIQD Take 237 mLs by mouth 2 (two) times daily between meals. Patient not taking: No sig reported 08/16/13   Maretta Bees, MD  fluconazole (DIFLUCAN) 100 MG tablet Take 1 tablet (100 mg total) by mouth daily. For 7 more days from 08/16/13 Patient not taking: No sig reported 08/16/13   Maretta Bees, MD  folic  acid (FOLVITE) 1 MG tablet Take 1 tablet (1 mg total) by mouth daily. Patient not taking: No sig reported 08/16/13   Maretta Bees, MD  lidocaine (XYLOCAINE) 2 % solution Use as directed 10 mLs in the mouth or throat as needed for mouth pain. Patient not taking: No sig reported 07/28/18   Enid Derry, PA-C  LORazepam (ATIVAN) 1 MG tablet Take 1 tablet (1 mg total) by mouth 3 (three) times daily as needed for anxiety or sleep. Patient not taking: No sig reported 08/16/13   Maretta Bees, MD  nystatin (MYCOSTATIN) 100000 UNIT/ML suspension Take 5 mLs  (500,000 Units total) by mouth 4 (four) times daily. Patient not taking: No sig reported 08/16/13   Ghimire, Werner Lean, MD  OLANZapine (ZYPREXA) 10 MG tablet Take 1 tablet (10 mg total) by mouth at bedtime. Patient not taking: No sig reported 08/16/13   Maretta Bees, MD  thiamine 100 MG tablet Take 1 tablet (100 mg total) by mouth daily. Patient not taking: No sig reported 08/16/13   Maretta Bees, MD    Allergies    Bee venom  Review of Systems   Review of Systems  Constitutional: Negative for chills and fever.  Musculoskeletal: Positive for arthralgias and joint swelling.  All other systems reviewed and are negative.   Physical Exam Updated Vital Signs BP (!) 164/87   Pulse 94   Temp 98 F (36.7 C) (Oral)   Resp 18   SpO2 96%   Physical Exam Vitals and nursing note reviewed.  Constitutional:      General: He is not in acute distress.    Appearance: He is not toxic-appearing.  HENT:     Head: Normocephalic.  Eyes:     Pupils: Pupils are equal, round, and reactive to light.  Neck:     Comments: No cervical midline tenderness.  Cardiovascular:     Rate and Rhythm: Normal rate and regular rhythm.     Pulses: Normal pulses.     Heart sounds: Normal heart sounds. No murmur heard. No friction rub. No gallop.   Pulmonary:     Effort: Pulmonary effort is normal.     Breath sounds: Normal breath sounds.  Abdominal:     General: Abdomen is flat. There is no distension.     Palpations: Abdomen is soft.     Tenderness: There is no abdominal tenderness. There is no guarding or rebound.  Musculoskeletal:     Cervical back: Neck supple.     Comments: No thoracic or lumbar midline tenderness. Mild tenderness throughout left forearm. Tenderness throughout left wrist and hand with overlying edema. Radial pulse intact. Limited ROM of left wrist. Some ROM of left fingers. No tenderness of left shoulder/elbow.   Skin:    General: Skin is warm and dry.  Neurological:      General: No focal deficit present.     Mental Status: He is alert.  Psychiatric:        Mood and Affect: Mood normal.        Behavior: Behavior normal.     ED Results / Procedures / Treatments   Labs (all labs ordered are listed, but only abnormal results are displayed) Labs Reviewed - No data to display  EKG None  Radiology DG Forearm Left  Result Date: 07/08/2020 CLINICAL DATA:  Post fall on ice. EXAM: LEFT FOREARM - 2 VIEW COMPARISON:  Left hand and wrist radiographs-earlier same day FINDINGS: Redemonstrated comminuted fracture involving the distal  radial metadiaphysis with suspected intra-articular extension, better demonstrated on dedicated left wrist radiographs performed earlier same day. No additional fractures identified.  No elbow joint effusion. Joint spaces appear preserved. Expected soft tissue swelling about the distal radial fracture. No radiopaque foreign body. IMPRESSION: Redemonstrated comminuted fracture involving the distal radial metadiaphysis with suspected intra-articular extension, better demonstrated on dedicated left wrist radiographs performed earlier same day. Electronically Signed   By: Simonne Come M.D.   On: 07/08/2020 11:05   DG Wrist Complete Left  Result Date: 07/08/2020 CLINICAL DATA:  Pain following fall EXAM: LEFT WRIST - COMPLETE 3+ VIEW COMPARISON:  None. FINDINGS: Frontal, oblique, lateral, and ulnar deviation scaphoid images were obtained. There is a comminuted fracture of the distal radial metaphysis with a fracture fragment along the more medial aspect extending to the radiocarpal joint. There is dorsal angulation and displacement of the distal major fracture fragment with respect to the major proximal fragment. No other fractures are evident. No dislocation. Joint spaces appear normal. No erosion. IMPRESSION: Comminuted fracture distal radial metaphysis with fracture fragment extending into the radiocarpal joint. Dorsal displacement and dorsal  angulation of distal major fracture fragment with respect major proximal radial fracture fragment. No other fracture. No dislocation. No appreciable arthropathic change. Electronically Signed   By: Bretta Bang III M.D.   On: 07/08/2020 10:47   DG Hand Complete Left  Result Date: 07/08/2020 CLINICAL DATA:  Pain following fall EXAM: LEFT HAND - COMPLETE 3+ VIEW COMPARISON:  Left wrist radiographs July 08, 2020 FINDINGS: Frontal, oblique, and lateral views were obtained. There is a comminuted fracture of the distal radial metaphysis with a fracture fragment extending into the radiocarpal joint along the medial aspect. There is dorsal displacement and angulation of the distal major fracture fragment with respect the major proximal radial fracture fragment. No other fractures are appreciable. No dislocation. Joint spaces appear normal. IMPRESSION: Comminuted fracture distal radial metaphysis with a fracture fragment extending into the radiocarpal joint. There is dorsal angulation and displacement of the distal major fracture fragment compared to the major proximal radial fracture fragment. No other fractures. No dislocation. No appreciable arthropathic change. Electronically Signed   By: Bretta Bang III M.D.   On: 07/08/2020 10:46    Procedures Procedures (including critical care time)  Medications Ordered in ED Medications  propofol (DIPRIVAN) 10 mg/mL bolus/IV push 40 mg (40 mg Intravenous See Procedure Record 07/08/20 1359)  propofol (DIPRIVAN) 10 mg/mL bolus/IV push (  See Procedure Record 07/08/20 1400)  oxyCODONE-acetaminophen (PERCOCET/ROXICET) 5-325 MG per tablet 1 tablet (1 tablet Oral Given 07/08/20 1039)  bupivacaine (MARCAINE) 0.5 % injection 10 mL (10 mLs Infiltration Given by Other 07/08/20 1300)  HYDROmorphone (DILAUDID) injection 0.5 mg (0.5 mg Intravenous Given 07/08/20 1224)  propofol (DIPRIVAN) 10 mg/mL bolus/IV push (20 mg Intravenous Given 07/08/20 1316)  propofol  (DIPRIVAN) 10 mg/mL bolus/IV push (40 mg Intravenous Given 07/08/20 1318)  propofol (DIPRIVAN) 10 mg/mL bolus/IV push (30 mg Intravenous Given 07/08/20 1319)    ED Course  I have reviewed the triage vital signs and the nursing notes.  Pertinent labs & imaging results that were available during my care of the patient were reviewed by me and considered in my medical decision making (see chart for details).    MDM Rules/Calculators/A&P                         40 year old male presents to the ED due to left wrist/hand pain after a  mechanical fall yesterday on ice.  No head injury or loss of consciousness.  Patient is right-hand-dominant.  Upon arrival, stable vitals.  Patient mildly tachycardic at 104 likely due to pain.  Patient nontoxic-appearing.  Physical exam significant for tenderness throughout left forearm, wrist, and hand.  Radial pulse intact.  Limited range of motion of left wrist.  Left wrist/hand x-rays ordered at triage.  We will add left forearm given mild tenderness over proximal forearm.  No tenderness over left elbow or shoulder.  Oxycodone given for pain management.  X-ray personally reviewed which demonstrates: IMPRESSION:  Comminuted fracture distal radial metaphysis with fracture fragment  extending into the radiocarpal joint. Dorsal displacement and dorsal  angulation of distal major fracture fragment with respect major  proximal radial fracture fragment. No other fracture. No  dislocation. No appreciable arthropathic change.   Reduction performed by Earney HamburgMichael Jeffrey with orthopedics. See his note for full details. Orthopedics number given at discharge. Instructed patient to call today to schedule an appointment for further evaluation. Strict ED precautions discussed with patient. Patient states understanding and agrees to plan. Patient discharged home in no acute distress and stable vitals.  Discussed case with Dr. Fredderick PhenixBelfi who evaluated patient at bedside and agrees with  assessment and plan.  Final Clinical Impression(s) / ED Diagnoses Final diagnoses:  Closed fracture of distal end of left radius, unspecified fracture morphology, initial encounter    Rx / DC Orders ED Discharge Orders    None       Mannie Stabileberman, Devery Odwyer C, PA-C 07/08/20 1444    Rolan BuccoBelfi, Melanie, MD 07/11/20 [phone]

## 2020-07-08 NOTE — ED Notes (Signed)
Pt transported to xray 

## 2020-07-08 NOTE — Progress Notes (Signed)
Orthopedic Tech Progress Note Patient Details:  Chase Bush 11/25/1980 [id number] PA/MD did a reduction of the WRIST.  Ortho Devices Type of Ortho Device: Finger trap,Sugartong splint,Arm sling Ortho Device/Splint Location: LUE Ortho Device/Splint Interventions: Application,Adjustment   Post Interventions Patient Tolerated: Well Instructions Provided: Care of device   Donald Pore 07/08/2020, 1:52 PM

## 2020-07-08 NOTE — ED Triage Notes (Signed)
Patient complains of left wrist and hand pain after falling on the ice yesterday. Patient slipped and landed on the ice with his arm between the ice and his body, did not catch himself on the way down. No LOC.

## 2020-07-08 NOTE — Progress Notes (Signed)
RT at bedside for a conscious sedation procedure.  Suction and ambu bag set up.  Patient on ETCO2 monitoring. Airway cart at the door.  RT remained until sedation cleared and patient was talking with staff.

## 2020-07-08 NOTE — Consult Note (Signed)
Reason for Consult:Left wrist fx Referring Physician: Ardeen Jourdain Time called: 1149 Time at bedside: 1205   Chase Bush is an 40 y.o. male.  HPI: Chase Bush was out yesterday and slipped on the ice and fell. He hurt his left wrist but tried to tough it out at home. When he got up this morning it was hurting worse and came to the ED for evaluation. X-rays showed a distal radius fx and hand surgery was consulted. He is RHD and works in a [employer]  Past Medical History:  Diagnosis Date  . Pneumonia    "when I was little; got it again now" (08/10/2013)  . Substance abuse (HCC)    was using suboxone until Jan 3rd. also hx of benzo abuse.     Past Surgical History:  Procedure Laterality Date  . ESOPHAGOGASTRODUODENOSCOPY (EGD) WITH PROPOFOL N/A 08/12/2013   Procedure: ESOPHAGOGASTRODUODENOSCOPY (EGD) WITH PROPOFOL;  Surgeon: Vertell Novak., MD;  Location: [facility] ENDOSCOPY;  Service: Endoscopy;  Laterality: N/A;  needs fungal culture brushing  . NO PAST SURGERIES      No family history on file.  Social History:  reports that he has been smoking cigarettes. He has a 21.00 pack-year smoking history. He has never used smokeless tobacco. He reports current drug use. He reports that he does not drink alcohol.  Allergies:  Allergies  Allergen Reactions  . Bee Venom     Medications: I have reviewed the patient's current medications.  No results found for this or any previous visit (from the past 48 hour(s)).  DG Forearm Left  Result Date: 07/08/2020 CLINICAL DATA:  Post fall on ice. EXAM: LEFT FOREARM - 2 VIEW COMPARISON:  Left hand and wrist radiographs-earlier same day FINDINGS: Redemonstrated comminuted fracture involving the distal radial metadiaphysis with suspected intra-articular extension, better demonstrated on dedicated left wrist radiographs performed earlier same day. No additional fractures identified.  No elbow joint effusion. Joint spaces appear preserved. Expected soft  tissue swelling about the distal radial fracture. No radiopaque foreign body. IMPRESSION: Redemonstrated comminuted fracture involving the distal radial metadiaphysis with suspected intra-articular extension, better demonstrated on dedicated left wrist radiographs performed earlier same day. Electronically Signed   By: Simonne Come M.D.   On: 07/08/2020 11:05   DG Wrist Complete Left  Result Date: 07/08/2020 CLINICAL DATA:  Pain following fall EXAM: LEFT WRIST - COMPLETE 3+ VIEW COMPARISON:  None. FINDINGS: Frontal, oblique, lateral, and ulnar deviation scaphoid images were obtained. There is a comminuted fracture of the distal radial metaphysis with a fracture fragment along the more medial aspect extending to the radiocarpal joint. There is dorsal angulation and displacement of the distal major fracture fragment with respect to the major proximal fragment. No other fractures are evident. No dislocation. Joint spaces appear normal. No erosion. IMPRESSION: Comminuted fracture distal radial metaphysis with fracture fragment extending into the radiocarpal joint. Dorsal displacement and dorsal angulation of distal major fracture fragment with respect major proximal radial fracture fragment. No other fracture. No dislocation. No appreciable arthropathic change. Electronically Signed   By: Bretta Bang III M.D.   On: 07/08/2020 10:47   DG Hand Complete Left  Result Date: 07/08/2020 CLINICAL DATA:  Pain following fall EXAM: LEFT HAND - COMPLETE 3+ VIEW COMPARISON:  Left wrist radiographs July 08, 2020 FINDINGS: Frontal, oblique, and lateral views were obtained. There is a comminuted fracture of the distal radial metaphysis with a fracture fragment extending into the radiocarpal joint along the medial aspect. There is dorsal  displacement and angulation of the distal major fracture fragment with respect the major proximal radial fracture fragment. No other fractures are appreciable. No dislocation. Joint  spaces appear normal. IMPRESSION: Comminuted fracture distal radial metaphysis with a fracture fragment extending into the radiocarpal joint. There is dorsal angulation and displacement of the distal major fracture fragment compared to the major proximal radial fracture fragment. No other fractures. No dislocation. No appreciable arthropathic change. Electronically Signed   By: Bretta Bang III M.D.   On: 07/08/2020 10:46    Review of Systems  HENT: Negative for ear discharge, ear pain, hearing loss and tinnitus.   Eyes: Negative for photophobia and pain.  Respiratory: Negative for cough and shortness of breath.   Cardiovascular: Negative for chest pain.  Gastrointestinal: Negative for abdominal pain, nausea and vomiting.  Genitourinary: Negative for dysuria, flank pain, frequency and urgency.  Musculoskeletal: Positive for arthralgias (Left wrist). Negative for back pain, myalgias and neck pain.  Neurological: Negative for dizziness and headaches.  Hematological: Does not bruise/bleed easily.  Psychiatric/Behavioral: The patient is not nervous/anxious.    Blood pressure (!) 165/97, pulse 94, temperature 98 F (36.7 C), temperature source Oral, resp. rate 19, SpO2 95 %. Physical Exam Constitutional:      General: He is not in acute distress.    Appearance: He is well-developed and well-nourished. He is not diaphoretic.  HENT:     Head: Normocephalic and atraumatic.  Eyes:     General: No scleral icterus.       Right eye: No discharge.        Left eye: No discharge.     Conjunctiva/sclera: Conjunctivae normal.  Cardiovascular:     Rate and Rhythm: Normal rate and regular rhythm.  Pulmonary:     Effort: Pulmonary effort is normal. No respiratory distress.  Musculoskeletal:     Cervical back: Normal range of motion.     Comments: Left shoulder, elbow, wrist, digits- no skin wounds, severe TTP wrist, no instability, no blocks to motion  Sens  Ax/R/M/U intact  Mot   Ax/ R/ PIN/ M/  AIN/ U intact  Rad 2+  Skin:    General: Skin is warm and dry.  Neurological:     Mental Status: He is alert.  Psychiatric:        Mood and Affect: Mood and affect normal.        Behavior: Behavior normal.     Assessment/Plan: Left wrist fx -- Will plan on CR and splinting. F/u next week as OP with Dr. Roney Mans. NWB. Tobacco use    Freeman Caldron, PA-C Orthopedic Surgery [phone] 07/08/2020, 12:13 PM

## 2020-07-08 NOTE — Discharge Instructions (Addendum)
Keep splint intact and dry.  No lifting, pushing, or pulling with left arm.  I am sending you home with two pain medications. Save the hydrocodone for sever pain and take the naproxen for mild to moderate pain. Call the orthopedic surgeon today or tomorrow to schedule an appointment for further evaluation. Return to the ER for new or worsening symptoms.

## 2020-12-18 ENCOUNTER — Emergency Department
Admission: EM | Admit: 2020-12-18 | Discharge: 2020-12-18 | Disposition: A | Discharge status: Home / Self Care | Payer: Self-pay | Attending: Emergency Medicine | Admitting: Emergency Medicine

## 2020-12-18 ENCOUNTER — Encounter: Payer: Self-pay | Admitting: Emergency Medicine

## 2020-12-18 ENCOUNTER — Emergency Department: Payer: Self-pay

## 2020-12-18 ENCOUNTER — Other Ambulatory Visit: Payer: Self-pay

## 2020-12-18 DIAGNOSIS — L03211 Cellulitis of face: Secondary | ICD-10-CM | POA: Insufficient documentation

## 2020-12-18 DIAGNOSIS — F17210 Nicotine dependence, cigarettes, uncomplicated: Secondary | ICD-10-CM | POA: Insufficient documentation

## 2020-12-18 DIAGNOSIS — G20 Parkinson's disease: Secondary | ICD-10-CM | POA: Insufficient documentation

## 2020-12-18 LAB — CBC WITH DIFFERENTIAL/PLATELET
Abs Immature Granulocytes: 0.06 10*3/uL (ref 0.00–0.07)
Basophils Absolute: 0 10*3/uL (ref 0.0–0.1)
Basophils Relative: 0 %
Eosinophils Absolute: 0.2 10*3/uL (ref 0.0–0.5)
Eosinophils Relative: 1 %
HCT: 47.1 % (ref 39.0–52.0)
Hemoglobin: 15.4 g/dL (ref 13.0–17.0)
Immature Granulocytes: 0 %
Lymphocytes Relative: 15 %
Lymphs Abs: 2.7 10*3/uL (ref 0.7–4.0)
MCH: 30 pg (ref 26.0–34.0)
MCHC: 32.7 g/dL (ref 30.0–36.0)
MCV: 91.6 fL (ref 80.0–100.0)
Monocytes Absolute: 2 10*3/uL — ABNORMAL HIGH (ref 0.1–1.0)
Monocytes Relative: 12 %
Neutro Abs: 12.6 10*3/uL — ABNORMAL HIGH (ref 1.7–7.7)
Neutrophils Relative %: 72 %
Platelets: 431 10*3/uL — ABNORMAL HIGH (ref 150–400)
RBC: 5.14 MIL/uL (ref 4.22–5.81)
RDW: 13.9 % (ref 11.5–15.5)
WBC: 17.5 10*3/uL — ABNORMAL HIGH (ref 4.0–10.5)
nRBC: 0 % (ref 0.0–0.2)

## 2020-12-18 LAB — COMPREHENSIVE METABOLIC PANEL
ALT: 29 U/L (ref 0–44)
AST: 24 U/L (ref 15–41)
Albumin: 4 g/dL (ref 3.5–5.0)
Alkaline Phosphatase: 52 U/L (ref 38–126)
Anion gap: 7 (ref 5–15)
BUN: 18 mg/dL (ref 6–20)
CO2: 30 mmol/L (ref 22–32)
Calcium: 9 mg/dL (ref 8.9–10.3)
Chloride: 100 mmol/L (ref 98–111)
Creatinine, Ser: 0.61 mg/dL (ref 0.61–1.24)
GFR, Estimated: >60 mL/min (ref >60)
Glucose, Bld: 94 mg/dL (ref 70–99)
Potassium: 4.2 mmol/L (ref 3.5–5.1)
Sodium: 137 mmol/L (ref 135–145)
Total Bilirubin: 0.9 mg/dL (ref 0.3–1.2)
Total Protein: 8.2 g/dL — ABNORMAL HIGH (ref 6.5–8.1)

## 2020-12-18 LAB — LACTIC ACID, PLASMA: Lactic Acid, Venous: 0.8 mmol/L (ref 0.5–1.9)

## 2020-12-18 MED ORDER — SULFAMETHOXAZOLE-TRIMETHOPRIM 800-160 MG PO TABS: 1.0000 | ORAL_TABLET | Freq: Two times a day (BID) | ORAL | 0 refills | Status: AC

## 2020-12-18 MED ORDER — SULFAMETHOXAZOLE-TRIMETHOPRIM 800-160 MG PO TABS
1.0000 | ORAL_TABLET | Freq: Once | ORAL | Status: AC
  Administered 2020-12-18: 18:00:00 1 via ORAL
  Filled 2020-12-18: qty 1

## 2020-12-18 MED ORDER — IOHEXOL 300 MG/ML  SOLN
75.0000 mL | Freq: Once | INTRAMUSCULAR | Status: AC | PRN
  Administered 2020-12-18: 16:00:00 75 mL via INTRAVENOUS
  Filled 2020-12-18: qty 75

## 2020-12-18 MED ORDER — SODIUM CHLORIDE 0.9 % IV BOLUS
1000.0000 mL | Freq: Once | INTRAVENOUS | Status: AC
  Administered 2020-12-18: 15:00:00 1000 mL via INTRAVENOUS

## 2020-12-18 MED ORDER — SODIUM CHLORIDE 0.9 % IV SOLN
1.0000 g | Freq: Once | INTRAVENOUS | Status: AC
  Administered 2020-12-18: 15:00:00 1 g via INTRAVENOUS
  Filled 2020-12-18: qty 10

## 2020-12-18 MED ORDER — CEPHALEXIN 500 MG PO CAPS: 500.0000 mg | ORAL_CAPSULE | Freq: Three times a day (TID) | ORAL | 0 refills | Status: AC

## 2020-12-18 MED ORDER — CEPHALEXIN 500 MG PO CAPS
500.0000 mg | ORAL_CAPSULE | Freq: Once | ORAL | Status: AC
  Administered 2020-12-18: 18:00:00 500 mg via ORAL
  Filled 2020-12-18: qty 1

## 2020-12-18 NOTE — ED Provider Notes (Signed)
[facility] Emergency Department Provider Note ____________________________________________   Event Date/Time   First MD Initiated Contact with Patient 12/18/20 1404     (approximate)  I have reviewed the triage vital signs and the nursing notes.   HISTORY  Chief Complaint Facial Swelling  HPI Chase Bush is a 40 y.o. male with history of substance abuse presents to the emergency department for treatment and evaluation of facial swelling. Symptoms started 2 days ago after eating [place] food. He also reports fever and decreased energy level. No relief with tylenol.         Past Medical History:  Diagnosis Date   Pneumonia    "when I was little; got it again now" (08/10/2013)   Substance abuse [facility])    was using suboxone until Jan 3rd. also hx of benzo abuse.     Patient Active Problem List   Diagnosis Date Noted   Dehydration 08/10/2013   Severe protein-calorie malnutrition (HCC) 08/10/2013   Aspiration pneumonia (HCC) 08/10/2013   Myoclonus 08/10/2013   Parkinsonian syndrome (HCC) 08/10/2013    Past Surgical History:  Procedure Laterality Date   ESOPHAGOGASTRODUODENOSCOPY (EGD) WITH PROPOFOL N/A 08/12/2013   Procedure: ESOPHAGOGASTRODUODENOSCOPY (EGD) WITH PROPOFOL;  Surgeon: Vertell Novak., MD;  Location: [facility] ENDOSCOPY;  Service: Endoscopy;  Laterality: N/A;  needs fungal culture brushing   NO PAST SURGERIES      Prior to Admission medications   Medication Sig Start Date End Date Taking? Authorizing Provider  acyclovir (ZOVIRAX) 200 MG capsule Take 2 capsules (400 mg total) by mouth 5 (five) times daily. For 7 more days from 08/16/13 Patient not taking: No sig reported 08/16/13   Maretta Bees, MD  Alum & Mag Hydroxide-Simeth (MAGIC MOUTHWASH W/LIDOCAINE) SOLN Take 5 mLs by mouth every 4 (four) hours. Patient not taking: No sig reported 08/16/13   Maretta Bees, MD  amLODipine (NORVASC) 2.5 MG tablet Take 1 tablet (2.5 mg  total) by mouth daily. Patient not taking: No sig reported 08/16/13   Maretta Bees, MD  amoxicillin (AMOXIL) 500 MG capsule Take 1 capsule (500 mg total) by mouth 3 (three) times daily. Patient not taking: No sig reported 07/28/18   Enid Derry, PA-C  feeding supplement, ENSURE COMPLETE, (ENSURE COMPLETE) LIQD Take 237 mLs by mouth 2 (two) times daily between meals. Patient not taking: No sig reported 08/16/13   Maretta Bees, MD  fluconazole (DIFLUCAN) 100 MG tablet Take 1 tablet (100 mg total) by mouth daily. For 7 more days from 08/16/13 Patient not taking: No sig reported 08/16/13   Maretta Bees, MD  folic acid (FOLVITE) 1 MG tablet Take 1 tablet (1 mg total) by mouth daily. Patient not taking: No sig reported 08/16/13   Maretta Bees, MD  HYDROcodone-acetaminophen (NORCO/VICODIN) 5-325 MG tablet Take 1 tablet by mouth every 4 (four) hours as needed for severe pain. 07/08/20   Mannie Stabile, PA-C  lidocaine (XYLOCAINE) 2 % solution Use as directed 10 mLs in the mouth or throat as needed for mouth pain. Patient not taking: No sig reported 07/28/18   Enid Derry, PA-C  LORazepam (ATIVAN) 1 MG tablet Take 1 tablet (1 mg total) by mouth 3 (three) times daily as needed for anxiety or sleep. Patient not taking: No sig reported 08/16/13   Maretta Bees, MD  naproxen (NAPROSYN) 500 MG tablet Take 1 tablet (500 mg total) by mouth 2 (two) times daily. 07/08/20   Mannie Stabile,  PA-C  nystatin (MYCOSTATIN) 100000 UNIT/ML suspension Take 5 mLs (500,000 Units total) by mouth 4 (four) times daily. Patient not taking: No sig reported 08/16/13   Ghimire, Werner Lean, MD  OLANZapine (ZYPREXA) 10 MG tablet Take 1 tablet (10 mg total) by mouth at bedtime. Patient not taking: No sig reported 08/16/13   Maretta Bees, MD  thiamine 100 MG tablet Take 1 tablet (100 mg total) by mouth daily. Patient not taking: No sig reported 08/16/13   Maretta Bees, MD    Allergies Bee  venom  History reviewed. No pertinent family history.  Social History Social History   Tobacco Use   Smoking status: Every Day    Packs/day: 1.00    Years: 21.00    Pack years: 21.00    Types: Cigarettes   Smokeless tobacco: Never  Substance Use Topics   Alcohol use: No   Drug use: Yes    Comment: 08/10/2013 "last drug use was 06/17/2013"    Review of Systems  Constitutional: Positive for fever/chills Eyes: No visual changes. ENT: No sore throat. No swelling sensation of tongue. Positive for swelling of upper lip. Cardiovascular: Denies chest pain. Respiratory: Denies shortness of breath. Gastrointestinal: No abdominal pain.  No nausea, no vomiting.  No diarrhea.  No constipation. Genitourinary: Negative for dysuria. Musculoskeletal: Negative for back pain. Skin: Negative for rash. Neurological: Negative for headaches, focal weakness or numbness. ____________________________________________   PHYSICAL EXAM:  VITAL SIGNS: ED Triage Vitals [12/18/20 1240]  Enc Vitals Group     BP 138/83     Pulse Rate (!) 114     Resp 20     Temp 99.6 F (37.6 C)     Temp Source Oral     SpO2 100 %     Weight 225 lb (102.1 kg)     Height 6\' 2"  (1.88 m)     Head Circumference      Peak Flow      Pain Score 9     Pain Loc      Pain Edu?      Excl. in GC?     Constitutional: Alert and oriented. Well appearing and in no acute distress. Eyes: Conjunctivae are normal. Head: Atraumatic. Nose: No congestion/rhinnorhea. Appearance of blue metallic substance on left nasal septal wall. Mouth/Throat: Mucous membranes are moist.  Oropharynx non-erythematous. Neck: No stridor.   Hematological/Lymphatic/Immunilogical: No cervical lymphadenopathy. Cardiovascular: Normal rate, regular rhythm. Grossly normal heart sounds.  Good peripheral circulation. Respiratory: Normal respiratory effort.  No retractions. Lungs CTAB. Gastrointestinal: Soft and nontender. No distention. No abdominal  bruits. No CVA tenderness. Genitourinary:  Musculoskeletal: No lower extremity tenderness nor edema.  No joint effusions. Neurologic:  Normal speech and language. No gross focal neurologic deficits are appreciated. No gait instability. Skin:  Skin is warm, dry and intact. No rash noted. Upper lip edema. No obvious area of fluctuance/abscess inside or outside lip or nose. Blue metallic substance on fingernails. Psychiatric: Mood and affect are normal. Speech and behavior are normal.  ____________________________________________   LABS (all labs ordered are listed, but only abnormal results are displayed)  Labs Reviewed  CULTURE, BLOOD (ROUTINE X 2)  CULTURE, BLOOD (ROUTINE X 2)  COMPREHENSIVE METABOLIC PANEL  CBC WITH DIFFERENTIAL/PLATELET   ____________________________________________  EKG  Not indicated. ____________________________________________  RADIOLOGY  ED MD interpretation:    Not indicated.  I, , personally viewed and evaluated these images (plain radiographs) as part of my medical decision making, as well as reviewing  the written report by the radiologist.  Official radiology report(s): No results found.  ____________________________________________   PROCEDURES  Procedure(s) performed (including Critical Care):  Procedures  ____________________________________________   INITIAL IMPRESSION / ASSESSMENT AND PLAN     40 year old male presenting to the emergency department for treatment and evaluation of upper lip swelling and pain with fever.  See HPI for further details.  Plan will be to get some labs, give IV fluids, and antibiotics.  This does not appear to be an allergic reaction from the [place] food that he ate 2 days ago.  He is febrile and tachycardic.  DIFFERENTIAL DIAGNOSIS  Periapical abscess, facial cellulitis, sepsis  ED COURSE  Difficulty obtaining labs and IV access per RN.  Patient care relinquished to C. Su Hilt, PA-C  who will follow up on labs and imaging with likely discharge home on antibiotics and outpatient follow up.    ___________________________________________   FINAL CLINICAL IMPRESSION(S) / ED DIAGNOSES  Final diagnoses:  None   Clinical Impression: facial cellulitis  ED Discharge Orders     None        Chase Bush was evaluated in Emergency Department on 12/18/2020 for the symptoms described in the history of present illness. He was evaluated in the context of the global COVID-19 pandemic, which necessitated consideration that the patient might be at risk for infection with the SARS-CoV-2 virus that causes COVID-19. Institutional protocols and algorithms that pertain to the evaluation of patients at risk for COVID-19 are in a state of rapid change based on information released by regulatory bodies including the CDC and federal and state organizations. These policies and algorithms were followed during the patient's care in the ED.   Note:  This document was prepared using Dragon voice recognition software and may include unintentional dictation errors.    Chinita Pester, FNP 12/18/20 2326    Concha Se, MD 12/19/20 1316

## 2020-12-18 NOTE — ED Notes (Signed)
This RN attempted blood draw and IV placement x 2 without success, Lab and charge nurse called.

## 2020-12-18 NOTE — Discharge Instructions (Addendum)
Please take Antibiotics as prescribed. Return to the ER if you have any worsening of symptoms. Otherwise, follow up with primary care doctor.

## 2020-12-18 NOTE — ED Triage Notes (Addendum)
Pt via POV from home. Pt c/o upper lip and nose "swelling" since Friday. Denies difficulty breathing or swallowing. States he ate some [place] food on Friday during lunch and he says it started after that, doesn't know if he is related. Denies BP meds use. 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 Pt states the swelling has progressively gotten worse. Pt states it is tender to the touch. Only known allergy is bee venom. Pt is A&Ox4 and NAD.

## 2020-12-18 NOTE — ED Notes (Signed)
Patient transported to CT 

## 2020-12-18 NOTE — ED Provider Notes (Signed)
  Physical Exam  BP (!) 134/92 (BP Location: Left Arm)   Pulse 95   Temp 99.6 F (37.6 C) (Oral)   Resp 18   Ht 6\' 2"  (1.88 m)   Wt 102.1 kg   SpO2 98%   BMI 28.89 kg/m   Physical Exam  ED Course/Procedures     Procedures  MDM  Patient is received in handoff from [facility], NP. Patient awaiting labs, receiving Rocephin and IV fluids. Patient labs noted with WBC of 17.5, lactic negative. CT ordered and shows phlegmon with possible early abscess. Discussed with Dr. [facility], agrees with outpatient keflex and bactrim. Patient given return precautions and patient is stable at this time for outpatient follow up.        Erma Heritage, PA 12/18/20 1941    02/18/21, MD 12/18/20 [phone]

## 2020-12-23 LAB — CULTURE, BLOOD (ROUTINE X 2)
Culture: NO GROWTH
Culture: NO GROWTH
Special Requests: ADEQUATE

## 2021-04-15 IMAGING — DX DG WRIST 2V*L*
1 series · 3 of 3 positions shown · non-contrast
Comparison: 07/08/2020

CLINICAL DATA: Post reduction

EXAM:
LEFT WRIST - 2 VIEW

[Series 1: wrist · 0.14mm/px · 3 of 3 slices shown]
[im 1/3]
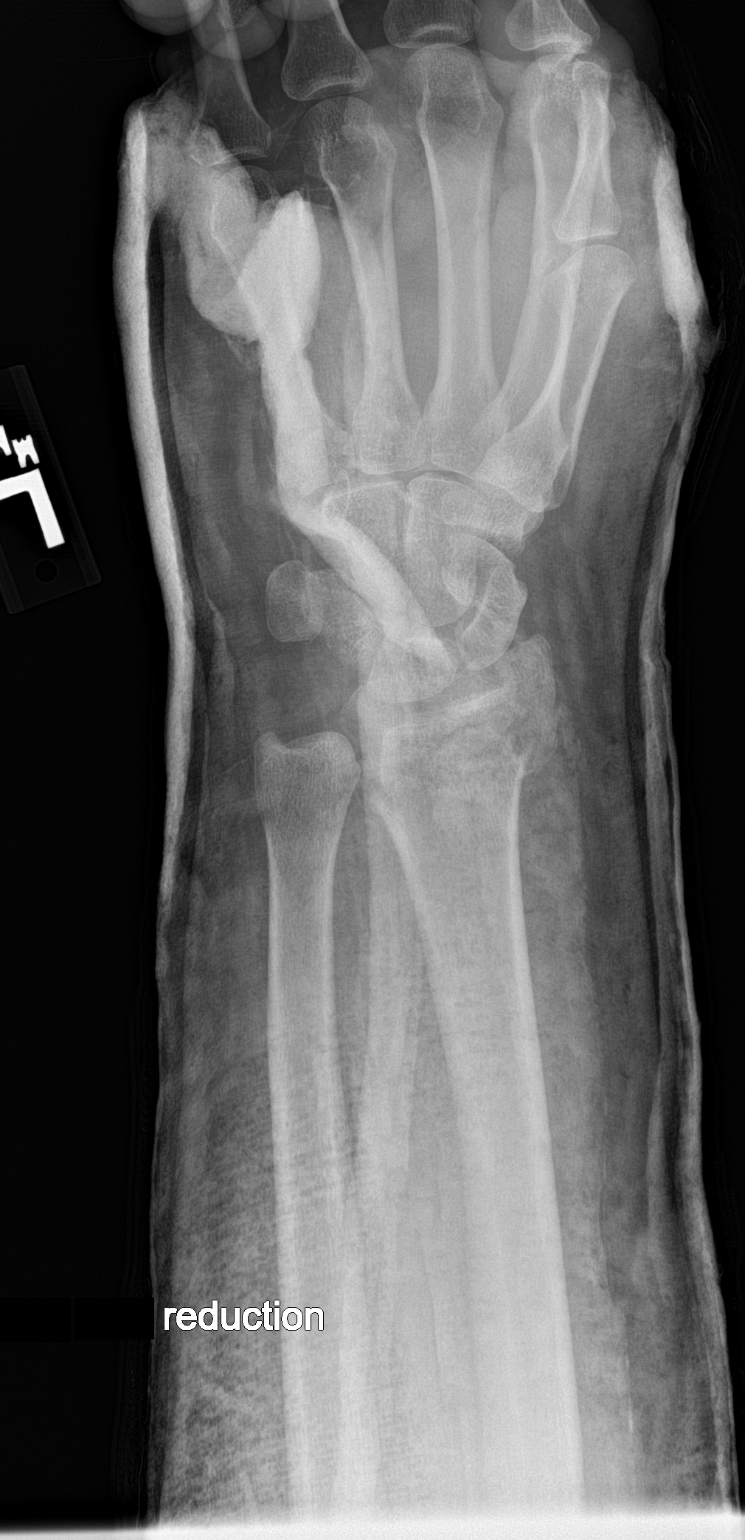
[im 2/3]
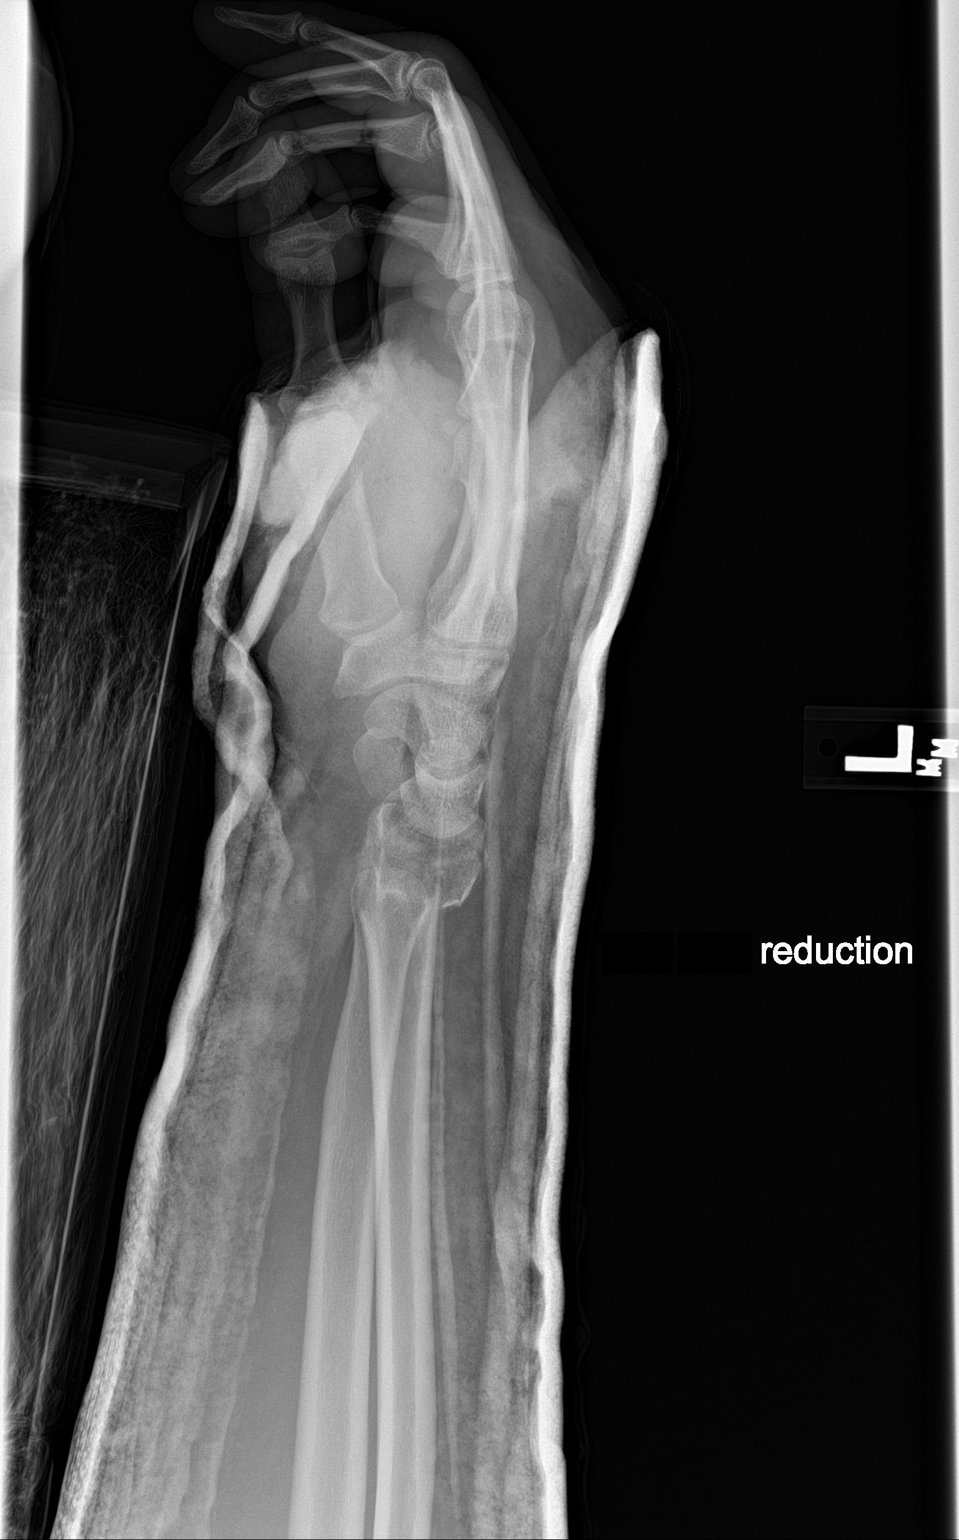
[im 3/3]
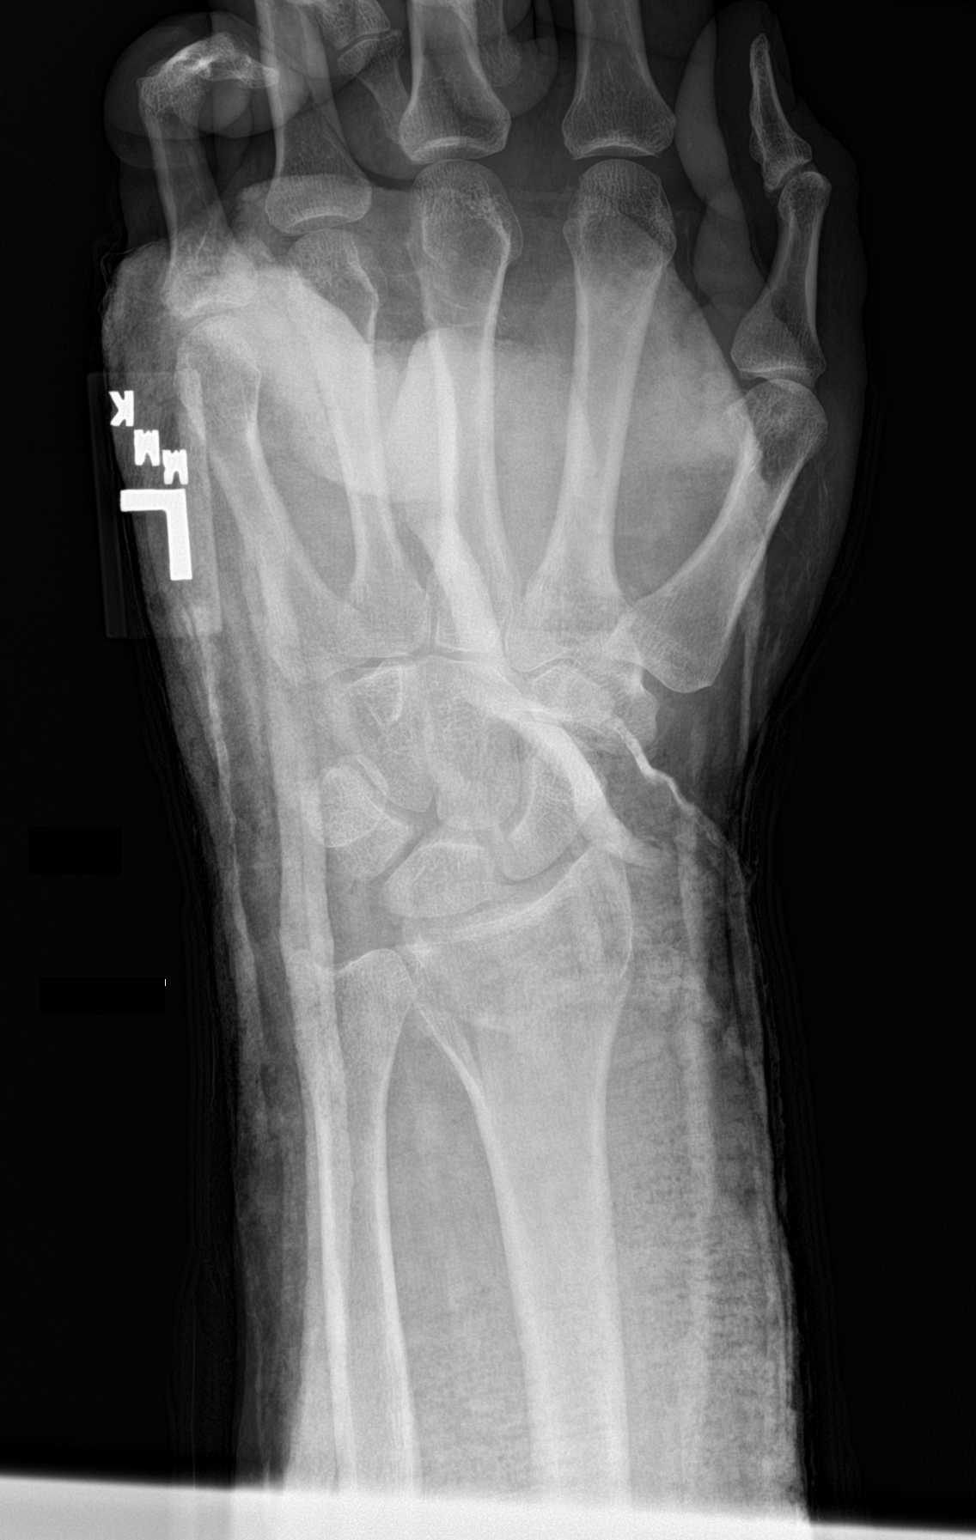

[3 of 3 positions shown; findings below may reference images not displayed]

FINDINGS: Interval improvement in alignment of comminuted distal radius
fracture status post cast placement.
IMPRESSION: Interval improvement in alignment of comminuted distal radius
fracture status post reduction and cast placement.
